# Patient Record
Sex: Male | Born: 1965 | Hispanic: Yes | Marital: Married | Smoking: Former smoker
Health system: Southern US, Community
[De-identification: ages and names within clinical notes are randomized; demographics above are authoritative.]

## PROBLEM LIST (undated history)

## (undated) DIAGNOSIS — E349 Endocrine disorder, unspecified: Secondary | ICD-10-CM

## (undated) DIAGNOSIS — M7710 Lateral epicondylitis, unspecified elbow: Secondary | ICD-10-CM

## (undated) DIAGNOSIS — E7800 Pure hypercholesterolemia, unspecified: Secondary | ICD-10-CM

## (undated) DIAGNOSIS — M25519 Pain in unspecified shoulder: Secondary | ICD-10-CM

## (undated) DIAGNOSIS — M5116 Intervertebral disc disorders with radiculopathy, lumbar region: Secondary | ICD-10-CM

## (undated) DIAGNOSIS — F411 Generalized anxiety disorder: Secondary | ICD-10-CM

## (undated) DIAGNOSIS — M1712 Unilateral primary osteoarthritis, left knee: Secondary | ICD-10-CM

## (undated) DIAGNOSIS — I10 Essential (primary) hypertension: Secondary | ICD-10-CM

## (undated) DIAGNOSIS — F419 Anxiety disorder, unspecified: Secondary | ICD-10-CM

## (undated) DIAGNOSIS — F32A Depression, unspecified: Secondary | ICD-10-CM

## (undated) DIAGNOSIS — M549 Dorsalgia, unspecified: Secondary | ICD-10-CM

## (undated) DIAGNOSIS — K219 Gastro-esophageal reflux disease without esophagitis: Secondary | ICD-10-CM

## (undated) HISTORY — DX: Anxiety disorder, unspecified: F41.9

## (undated) HISTORY — DX: Essential (primary) hypertension: I10

## (undated) HISTORY — DX: Unilateral primary osteoarthritis, left knee: M17.12

## (undated) HISTORY — DX: Endocrine disorder, unspecified: E34.9

## (undated) HISTORY — DX: Depression, unspecified: F32.A

## (undated) HISTORY — DX: Lateral epicondylitis, unspecified elbow: M77.10

## (undated) HISTORY — DX: Pure hypercholesterolemia, unspecified: E78.00

## (undated) HISTORY — PX: BACK SURGERY: SHX140

## (undated) HISTORY — DX: Gastro-esophageal reflux disease without esophagitis: K21.9

## (undated) HISTORY — DX: Generalized anxiety disorder: F41.1

## (undated) HISTORY — DX: Intervertebral disc disorders with radiculopathy, lumbar region: M51.16

## (undated) HISTORY — PX: ROTATOR CUFF REPAIR: SHX139

---

## 1998-09-29 ENCOUNTER — Ambulatory Visit (HOSPITAL_COMMUNITY): Admission: RE | Admit: 1998-09-29 | Discharge: 1998-09-29 | Payer: Self-pay | Admitting: Neurosurgery

## 1998-09-29 ENCOUNTER — Encounter: Payer: Self-pay | Admitting: Neurosurgery

## 1998-10-14 ENCOUNTER — Ambulatory Visit (HOSPITAL_COMMUNITY): Admission: RE | Admit: 1998-10-14 | Discharge: 1998-10-14 | Payer: Self-pay | Admitting: Neurosurgery

## 1998-10-14 ENCOUNTER — Encounter: Payer: Self-pay | Admitting: Neurosurgery

## 1998-10-28 ENCOUNTER — Ambulatory Visit (HOSPITAL_COMMUNITY): Admission: RE | Admit: 1998-10-28 | Discharge: 1998-10-28 | Payer: Self-pay | Admitting: Neurosurgery

## 1998-10-28 ENCOUNTER — Encounter: Payer: Self-pay | Admitting: Neurosurgery

## 1999-04-05 ENCOUNTER — Ambulatory Visit (HOSPITAL_COMMUNITY): Admission: RE | Admit: 1999-04-05 | Discharge: 1999-04-05 | Payer: Self-pay | Admitting: Neurosurgery

## 1999-04-05 ENCOUNTER — Encounter: Payer: Self-pay | Admitting: Neurosurgery

## 2000-01-13 ENCOUNTER — Encounter: Payer: Self-pay | Admitting: Neurosurgery

## 2000-01-13 ENCOUNTER — Ambulatory Visit (HOSPITAL_COMMUNITY): Admission: RE | Admit: 2000-01-13 | Discharge: 2000-01-13 | Payer: Self-pay | Admitting: Neurosurgery

## 2000-01-23 ENCOUNTER — Encounter: Admission: RE | Admit: 2000-01-23 | Discharge: 2000-02-26 | Payer: Self-pay | Admitting: Neurosurgery

## 2004-03-02 ENCOUNTER — Encounter: Admission: RE | Admit: 2004-03-02 | Discharge: 2004-03-02 | Payer: Self-pay | Admitting: Orthopedic Surgery

## 2004-03-03 ENCOUNTER — Encounter: Admission: RE | Admit: 2004-03-03 | Discharge: 2004-03-03 | Payer: Self-pay | Admitting: Orthopedic Surgery

## 2004-12-09 ENCOUNTER — Ambulatory Visit (HOSPITAL_COMMUNITY): Admission: RE | Admit: 2004-12-09 | Discharge: 2004-12-09 | Payer: Self-pay | Admitting: Specialist

## 2007-05-16 ENCOUNTER — Ambulatory Visit (HOSPITAL_COMMUNITY): Admission: RE | Admit: 2007-05-16 | Discharge: 2007-05-16 | Payer: Self-pay | Admitting: Otolaryngology

## 2007-09-17 ENCOUNTER — Encounter: Admission: RE | Admit: 2007-09-17 | Discharge: 2007-09-17 | Payer: Self-pay | Admitting: Family Medicine

## 2009-05-04 ENCOUNTER — Encounter: Admission: RE | Admit: 2009-05-04 | Discharge: 2009-05-04 | Payer: Self-pay | Admitting: Specialist

## 2014-07-28 ENCOUNTER — Other Ambulatory Visit (HOSPITAL_COMMUNITY): Payer: Self-pay | Admitting: Specialist

## 2014-07-28 DIAGNOSIS — Z139 Encounter for screening, unspecified: Secondary | ICD-10-CM

## 2014-07-30 ENCOUNTER — Ambulatory Visit (HOSPITAL_COMMUNITY)
Admission: RE | Admit: 2014-07-30 | Discharge: 2014-07-30 | Disposition: A | Discharge status: Home / Self Care | Payer: Self-pay | Source: Ambulatory Visit | Attending: Specialist | Admitting: Specialist

## 2014-07-30 DIAGNOSIS — Z139 Encounter for screening, unspecified: Secondary | ICD-10-CM | POA: Insufficient documentation

## 2016-01-15 ENCOUNTER — Encounter (HOSPITAL_COMMUNITY): Payer: Self-pay | Admitting: Emergency Medicine

## 2016-01-15 ENCOUNTER — Emergency Department (HOSPITAL_COMMUNITY)
Admission: EM | Admit: 2016-01-15 | Discharge: 2016-01-16 | Disposition: A | Discharge status: Other Acute Inpatient Hospital | Payer: BLUE CROSS/BLUE SHIELD | Attending: Emergency Medicine | Admitting: Emergency Medicine

## 2016-01-15 DIAGNOSIS — R112 Nausea with vomiting, unspecified: Secondary | ICD-10-CM | POA: Diagnosis not present

## 2016-01-15 DIAGNOSIS — Z79899 Other long term (current) drug therapy: Secondary | ICD-10-CM | POA: Diagnosis not present

## 2016-01-15 DIAGNOSIS — R197 Diarrhea, unspecified: Secondary | ICD-10-CM | POA: Diagnosis not present

## 2016-01-15 DIAGNOSIS — R509 Fever, unspecified: Secondary | ICD-10-CM | POA: Diagnosis not present

## 2016-01-15 DIAGNOSIS — F1110 Opioid abuse, uncomplicated: Secondary | ICD-10-CM | POA: Diagnosis not present

## 2016-01-15 DIAGNOSIS — F17210 Nicotine dependence, cigarettes, uncomplicated: Secondary | ICD-10-CM | POA: Insufficient documentation

## 2016-01-15 DIAGNOSIS — R45851 Suicidal ideations: Secondary | ICD-10-CM | POA: Diagnosis present

## 2016-01-15 HISTORY — DX: Dorsalgia, unspecified: M54.9

## 2016-01-15 HISTORY — DX: Pain in unspecified shoulder: M25.519

## 2016-01-15 LAB — ETHANOL

## 2016-01-15 LAB — COMPREHENSIVE METABOLIC PANEL
ALBUMIN: 4.4 g/dL (ref 3.5–5.0)
ALK PHOS: 57 U/L (ref 38–126)
ALT: 24 U/L (ref 17–63)
ANION GAP: 15 (ref 5–15)
AST: 33 U/L (ref 15–41)
BILIRUBIN TOTAL: 0.6 mg/dL (ref 0.3–1.2)
BUN: 10 mg/dL (ref 6–20)
CALCIUM: 9.9 mg/dL (ref 8.9–10.3)
CO2: 22 mmol/L (ref 22–32)
Chloride: 104 mmol/L (ref 101–111)
Creatinine, Ser: 1.08 mg/dL (ref 0.61–1.24)
GFR calc Af Amer: >60 mL/min (ref >60)
GLUCOSE: 159 mg/dL — AB (ref 65–99)
POTASSIUM: 4.3 mmol/L (ref 3.5–5.1)
Sodium: 141 mmol/L (ref 135–145)
TOTAL PROTEIN: 7.3 g/dL (ref 6.5–8.1)

## 2016-01-15 LAB — CBC
HEMATOCRIT: 57.3 % — AB (ref 39.0–52.0)
Hemoglobin: 20 g/dL — ABNORMAL HIGH (ref 13.0–17.0)
MCH: 30.6 pg (ref 26.0–34.0)
MCHC: 34.9 g/dL (ref 30.0–36.0)
MCV: 87.7 fL (ref 78.0–100.0)
PLATELETS: 246 10*3/uL (ref 150–400)
RBC: 6.53 MIL/uL — ABNORMAL HIGH (ref 4.22–5.81)
RDW: 13.7 % (ref 11.5–15.5)
WBC: 8.8 10*3/uL (ref 4.0–10.5)

## 2016-01-15 LAB — RAPID URINE DRUG SCREEN, HOSP PERFORMED
Amphetamines: NOT DETECTED
BARBITURATES: NOT DETECTED
Benzodiazepines: NOT DETECTED
Cocaine: NOT DETECTED
Opiates: POSITIVE — AB
Tetrahydrocannabinol: NOT DETECTED

## 2016-01-15 LAB — SALICYLATE LEVEL: Salicylate Lvl: <4 mg/dL (ref 2.8–30.0)

## 2016-01-15 LAB — ACETAMINOPHEN LEVEL: Acetaminophen (Tylenol), Serum: <10 ug/mL — ABNORMAL LOW (ref 10–30)

## 2016-01-15 MED ORDER — DIPHENOXYLATE-ATROPINE 2.5-0.025 MG PO TABS
1.0000 | ORAL_TABLET | Freq: Once | ORAL | Status: AC
  Administered 2016-01-15: 1 via ORAL
  Filled 2016-01-15: qty 1

## 2016-01-15 MED ORDER — ACETAMINOPHEN 325 MG PO TABS
650.0000 mg | ORAL_TABLET | Freq: Once | ORAL | Status: AC
  Administered 2016-01-15: 650 mg via ORAL

## 2016-01-15 MED ORDER — CHLORDIAZEPOXIDE HCL 25 MG PO CAPS
50.0000 mg | ORAL_CAPSULE | Freq: Once | ORAL | Status: AC
  Administered 2016-01-15: 50 mg via ORAL
  Filled 2016-01-15: qty 2

## 2016-01-15 MED ORDER — CLONIDINE HCL 0.1 MG PO TABS
0.1000 mg | ORAL_TABLET | Freq: Once | ORAL | Status: AC
  Administered 2016-01-15: 0.1 mg via ORAL
  Filled 2016-01-15: qty 1

## 2016-01-15 NOTE — ED Notes (Signed)
Notified staffing for sitter. 

## 2016-01-15 NOTE — ED Provider Notes (Signed)
CSN: [id number]     Arrival date & time 01/15/16  1724 History   First MD Initiated Contact with Patient 01/15/16 1751     Chief Complaint  Patient presents with  . Withdrawal  . SI      (Consider location/radiation/quality/duration/timing/severity/associated sxs/prior Treatment) Patient is a 50 y.o. male presenting with mental health disorder. The history is provided by the patient and a relative.  Mental Health Problem Presenting symptoms: suicidal thoughts and suicidal threats   Presenting symptoms: no aggressive behavior, no agitation, no hallucinations, no homicidal ideas and no suicide attempt   Patient accompanied by:  Family member Degree of incapacity (severity):  Moderate Onset quality:  Gradual Duration:  2 weeks Timing:  Constant Progression:  Worsening Chronicity:  New Context: drug abuse   Treatment compliance:  Untreated Relieved by:  Nothing Worsened by:  Nothing tried Ineffective treatments:  None tried Associated symptoms: no abdominal pain, no chest pain and no headaches     50 yo M With a chief complaint of suicidal ideation. Patient states he's been having trouble with his family and doing narcotics. Patient said he did heroin earlier this morning. Has had some withdrawal symptoms some vomiting and diarrhea as well as some chills. Denies cough congestion. Denies sick contacts. Has not had difficulties with withdrawal before. Denies history of suicidal ideation. Denies mental health history. Feels that he's at the end of his rope and mentioned a couple different times that he can wish he would go to sleep and not wake up. Has no current plan to commit suicide.  Past Medical History  Diagnosis Date  . Shoulder pain   . Back pain    Past Surgical History  Procedure Laterality Date  . Back surgery    . Rotator cuff repair     No family history on file. Social History  Substance Use Topics  . Smoking status: Current Some Day Smoker -- 0.20 packs/day   Types: Cigarettes  . Smokeless tobacco: Never Used  . Alcohol Use: No    Review of Systems  Constitutional: Positive for fever and chills.  HENT: Negative for congestion and facial swelling.   Eyes: Negative for discharge and visual disturbance.  Respiratory: Negative for shortness of breath.   Cardiovascular: Negative for chest pain and palpitations.  Gastrointestinal: Positive for nausea, vomiting and diarrhea. Negative for abdominal pain.  Musculoskeletal: Negative for myalgias and arthralgias.  Skin: Negative for color change and rash.  Neurological: Negative for tremors, syncope and headaches.  Psychiatric/Behavioral: Positive for suicidal ideas. Negative for homicidal ideas, hallucinations, confusion, dysphoric mood and agitation.      Allergies  Review of patient's allergies indicates no known allergies.  Home Medications   Prior to Admission medications   Medication Sig Start Date End Date Taking? Authorizing Provider  acetaminophen (TYLENOL) 500 MG tablet Take 1,000-1,500 mg by mouth every 6 (six) hours as needed for headache (pain).   Yes Historical Provider, MD  atenolol (TENORMIN) 25 MG tablet Take 25 mg by mouth daily.   Yes Historical Provider, MD  meloxicam (MOBIC) 7.5 MG tablet Take 7.5 mg by mouth daily as needed (muscle pain/sleep).   Yes Historical Provider, MD  traMADol (ULTRAM) 50 MG tablet Take 100 mg by mouth every 4 (four) hours as needed (pain).   Yes Historical Provider, MD   BP 137/101 mmHg  Pulse 101  Temp(Src) 98.1 F (36.7 C) (Oral)  Resp 16  Wt 170 lb (77.111 kg)  SpO2 97% Physical Exam  Constitutional:  He is oriented to person, place, and time. He appears well-developed and well-nourished.  Piloerection  HENT:  Head: Normocephalic and atraumatic.  Eyes: EOM are normal. Pupils are equal, round, and reactive to light.  Neck: Normal range of motion. Neck supple. No JVD present.  Cardiovascular: Normal rate and regular rhythm.  Exam reveals no  gallop and no friction rub.   No murmur heard. Pulmonary/Chest: No respiratory distress. He has no wheezes. He has no rales.  Abdominal: He exhibits no distension. There is no tenderness. There is no rebound and no guarding.  Musculoskeletal: Normal range of motion.  Neurological: He is alert and oriented to person, place, and time.  Skin: No rash noted. No pallor.  Psychiatric: He has a normal mood and affect. His behavior is normal.  Nursing note and vitals reviewed.   ED Course  Procedures (including critical care time) Labs Review Labs Reviewed  COMPREHENSIVE METABOLIC PANEL - Abnormal; Notable for the following:    Glucose, Bld 159 (*)    All other components within normal limits  ACETAMINOPHEN LEVEL - Abnormal; Notable for the following:    Acetaminophen (Tylenol), Serum <10 (*)    All other components within normal limits  CBC - Abnormal; Notable for the following:    RBC 6.53 (*)    Hemoglobin 20.0 (*)    HCT 57.3 (*)    All other components within normal limits  URINE RAPID DRUG SCREEN, HOSP PERFORMED - Abnormal; Notable for the following:    Opiates POSITIVE (*)    All other components within normal limits  ETHANOL  SALICYLATE LEVEL    Imaging Review No results found. I have personally reviewed and evaluated these images and lab results as part of my medical decision-making.   EKG Interpretation None      MDM   Final diagnoses:  Opiate abuse, continuous    50 yo M with a chief complaint of suicidal ideation. Patient has no current plan. Is complaining of some withdrawal symptoms. Some piloerection mild warmth of the skin on exam. Will treat symptomatically. TTS evaluation.  TTS recommending inpatient rehab.  Looking for placement.   The patients results and plan were reviewed and discussed.   Any x-rays performed were independently reviewed by myself.   Differential diagnosis were considered with the presenting HPI.  Medications  cloNIDine  (CATAPRES) tablet 0.1 mg (0.1 mg Oral Given 01/15/16 1859)  diphenoxylate-atropine (LOMOTIL) 2.5-0.025 MG per tablet 1 tablet (1 tablet Oral Given 01/15/16 1859)  chlordiazePOXIDE (LIBRIUM) capsule 50 mg (50 mg Oral Given 01/15/16 1859)  acetaminophen (TYLENOL) tablet 650 mg (650 mg Oral Given 01/15/16 2045)    Filed Vitals:   01/15/16 1900 01/15/16 1915 01/15/16 2000 01/15/16 2030  BP: 139/112 154/104 146/104 137/101  Pulse: 123 113 96 101  Temp:      TempSrc:      Resp: 18   16  Weight:      SpO2: 99% 96% 99% 97%    Final diagnoses:  Opiate abuse, continuous    Admission/ observation were discussed with the admitting physician, patient and/or family and they are comfortable with the plan.    Melene Plan, DO 01/15/16 2322

## 2016-01-15 NOTE — ED Notes (Signed)
Pt from home with daughter, requesting detox from opiate pills.  Pt states he was taking tramadol for his shoulder pain and then started buying other pills "on the side."  Pt states he is now having trouble sleeping and is having thoughts of hurting himself due to lack of sleep and pain.  Denies plan.  NAD, tearful, A&O.

## 2016-01-15 NOTE — ED Notes (Addendum)
Pt and family advised of visitor policy.  Given Pod C information sheet.  Pt family to take all of pt's belongings.  Security called to wand pt.

## 2016-01-15 NOTE — ED Notes (Signed)
Per [facility] ([place]), to seek outside placement for detox.

## 2016-01-15 NOTE — BHH Counselor (Signed)
TTS Consult incomplete due to poor connectivity. Writer contacted Varney Biles to inquire about pt being temporarily moved so that Clinical research associate can attempt consult again.
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
## 2016-01-16 ENCOUNTER — Encounter (HOSPITAL_COMMUNITY): Payer: Self-pay | Admitting: *Deleted

## 2016-01-16 ENCOUNTER — Encounter (HOSPITAL_COMMUNITY): Payer: Self-pay

## 2016-01-16 ENCOUNTER — Inpatient Hospital Stay (HOSPITAL_COMMUNITY)
Admission: AD | Admit: 2016-01-16 | Discharge: 2016-01-20 | DRG: 897 | Disposition: A | Discharge status: Home / Self Care | Payer: BLUE CROSS/BLUE SHIELD | Source: Intra-hospital | Attending: Psychiatry | Admitting: Psychiatry

## 2016-01-16 DIAGNOSIS — F1123 Opioid dependence with withdrawal: Principal | ICD-10-CM | POA: Diagnosis present

## 2016-01-16 DIAGNOSIS — F1120 Opioid dependence, uncomplicated: Secondary | ICD-10-CM | POA: Diagnosis present

## 2016-01-16 DIAGNOSIS — F331 Major depressive disorder, recurrent, moderate: Secondary | ICD-10-CM | POA: Diagnosis present

## 2016-01-16 DIAGNOSIS — F1193 Opioid use, unspecified with withdrawal: Secondary | ICD-10-CM

## 2016-01-16 DIAGNOSIS — R45851 Suicidal ideations: Secondary | ICD-10-CM | POA: Diagnosis not present

## 2016-01-16 DIAGNOSIS — G4700 Insomnia, unspecified: Secondary | ICD-10-CM | POA: Diagnosis present

## 2016-01-16 DIAGNOSIS — F17210 Nicotine dependence, cigarettes, uncomplicated: Secondary | ICD-10-CM | POA: Diagnosis present

## 2016-01-16 MED ORDER — LORAZEPAM 1 MG PO TABS
0.5000 mg | ORAL_TABLET | Freq: Three times a day (TID) | ORAL | Status: DC | PRN
  Administered 2016-01-16: 0.5 mg via ORAL
  Filled 2016-01-16: qty 1

## 2016-01-16 MED ORDER — TRAZODONE HCL 50 MG PO TABS
50.0000 mg | ORAL_TABLET | Freq: Every evening | ORAL | Status: DC | PRN
  Administered 2016-01-16 – 2016-01-18 (×3): 50 mg via ORAL
  Filled 2016-01-16 (×3): qty 1

## 2016-01-16 MED ORDER — NAPROXEN 500 MG PO TABS
500.0000 mg | ORAL_TABLET | Freq: Two times a day (BID) | ORAL | Status: DC | PRN
  Administered 2016-01-16 – 2016-01-19 (×6): 500 mg via ORAL
  Filled 2016-01-16 (×6): qty 1

## 2016-01-16 MED ORDER — ACETAMINOPHEN 500 MG PO TABS
1000.0000 mg | ORAL_TABLET | Freq: Four times a day (QID) | ORAL | Status: DC | PRN
  Administered 2016-01-16: 1000 mg via ORAL
  Filled 2016-01-16: qty 2

## 2016-01-16 MED ORDER — ALUM & MAG HYDROXIDE-SIMETH 200-200-20 MG/5ML PO SUSP: 30.0000 mL | ORAL | Status: DC | PRN

## 2016-01-16 MED ORDER — MELOXICAM 7.5 MG PO TABS
7.5000 mg | ORAL_TABLET | Freq: Every day | ORAL | Status: DC | PRN
Start: 2016-01-16 — End: 2016-01-16
  Administered 2016-01-16: 7.5 mg via ORAL
  Filled 2016-01-16: qty 1

## 2016-01-16 MED ORDER — CLONIDINE HCL 0.1 MG PO TABS
0.1000 mg | ORAL_TABLET | Freq: Four times a day (QID) | ORAL | Status: DC | PRN
  Administered 2016-01-16: 0.1 mg via ORAL
  Filled 2016-01-16: qty 1

## 2016-01-16 MED ORDER — HYDROXYZINE HCL 25 MG PO TABS
25.0000 mg | ORAL_TABLET | Freq: Four times a day (QID) | ORAL | Status: DC | PRN
  Administered 2016-01-16 – 2016-01-18 (×7): 25 mg via ORAL
  Filled 2016-01-16 (×7): qty 1

## 2016-01-16 MED ORDER — MAGNESIUM HYDROXIDE 400 MG/5ML PO SUSP: 30.0000 mL | Freq: Every day | ORAL | Status: DC | PRN

## 2016-01-16 MED ORDER — CLONIDINE HCL 0.1 MG PO TABS
0.1000 mg | ORAL_TABLET | ORAL | Status: AC
  Administered 2016-01-18 – 2016-01-19 (×4): 0.1 mg via ORAL
  Filled 2016-01-16 (×5): qty 1

## 2016-01-16 MED ORDER — CLONIDINE HCL 0.1 MG PO TABS
0.1000 mg | ORAL_TABLET | Freq: Four times a day (QID) | ORAL | Status: AC
  Administered 2016-01-16 – 2016-01-17 (×6): 0.1 mg via ORAL
  Filled 2016-01-16 (×8): qty 1

## 2016-01-16 MED ORDER — ONDANSETRON 4 MG PO TBDP: 4.0000 mg | ORAL_TABLET | Freq: Four times a day (QID) | ORAL | Status: DC | PRN

## 2016-01-16 MED ORDER — ACETAMINOPHEN 325 MG PO TABS
650.0000 mg | ORAL_TABLET | Freq: Four times a day (QID) | ORAL | Status: DC | PRN
  Administered 2016-01-16 – 2016-01-19 (×3): 650 mg via ORAL
  Filled 2016-01-16 (×4): qty 2

## 2016-01-16 MED ORDER — ATENOLOL 25 MG PO TABS
25.0000 mg | ORAL_TABLET | Freq: Every day | ORAL | Status: DC
  Administered 2016-01-16: 25 mg via ORAL
  Filled 2016-01-16: qty 1

## 2016-01-16 MED ORDER — CLONIDINE HCL 0.1 MG PO TABS
0.1000 mg | ORAL_TABLET | Freq: Every day | ORAL | Status: DC
  Administered 2016-01-20: 0.1 mg via ORAL
  Filled 2016-01-16 (×2): qty 1

## 2016-01-16 MED ORDER — LOPERAMIDE HCL 2 MG PO CAPS
2.0000 mg | ORAL_CAPSULE | ORAL | Status: DC | PRN
  Administered 2016-01-18: 4 mg via ORAL
  Filled 2016-01-16: qty 2

## 2016-01-16 MED ORDER — METHOCARBAMOL 500 MG PO TABS
500.0000 mg | ORAL_TABLET | Freq: Three times a day (TID) | ORAL | Status: DC | PRN
  Administered 2016-01-16 – 2016-01-19 (×5): 500 mg via ORAL
  Filled 2016-01-16 (×5): qty 1

## 2016-01-16 MED ORDER — DICYCLOMINE HCL 20 MG PO TABS
20.0000 mg | ORAL_TABLET | Freq: Four times a day (QID) | ORAL | Status: DC | PRN
  Administered 2016-01-16 – 2016-01-18 (×3): 20 mg via ORAL
  Filled 2016-01-16 (×3): qty 1

## 2016-01-16 NOTE — ED Provider Notes (Signed)
Patient was accepted to psychiatric facility by Dr Earlene Plater. Pt is medically stable, no acute distress on exam and able to be transferred safely to facility capable of providing appropriate care.   Lyndal Pulley, MD 01/16/16 1058

## 2016-01-16 NOTE — ED Notes (Signed)
Updated pt. And family on plan of care and visitor hours and restrictions.  They verbalized understanding

## 2016-01-16 NOTE — ED Notes (Signed)
Patient didn't want a snack, but he did have a drink at bedside, A regular diet ordered for lunch.
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
## 2016-01-16 NOTE — Progress Notes (Signed)
Pt accepted to [facility] observation unit by Dr Lolly Mustache to Fransisca Kaufmann NP. Report number is [id number], pt can be transferred 11am today.  Note- pt was referred for inpatient detox yesterday at [facility] and [place], however this morning [facility] states they do not have an inpatient opiate detox program, and [facility] states they did not receive the referral and advised fax again. Writer made referral in case of further detox needs.  Ilean Skill, MSW, LCSW Clinical Social Work, Disposition  01/16/2016 [phone]

## 2016-01-16 NOTE — ED Notes (Signed)
Pt.reports feeling anxious and restless.  Ativan 0.5mg  po given per MD orders

## 2016-01-16 NOTE — H&P (Signed)
[facility] Admission Assessment Adult  Patient Identification: Victor [name] MRN:  [id number] Date of Evaluation:  01/16/2016 Chief Complaint:  Patient states "I feel depressed because of my opiate use."  Principal Diagnosis: <principal problem not specified> Diagnosis:   Patient Active Problem List   Diagnosis Date Noted  . Opiate addiction [facility]) [F11.20] 01/16/2016   History of Present Illness::   Victor [name] is a 50 year old male who presented to the [facility] voluntarily with his daughter requesting detox from opiate pain pills. Patient reported taking ultram for at least sixteen years for shoulder pain but it became ineffective. He reports that his orthopedic MD wanted to perform another back surgery but patient declined. This led the patient to begin buying vicodin off the street to manage his pain. Patient tearfully states "I hurt my back years ago lifting something at work. I want to stop the medications. If I don't have them my body hurts and I can't function. This situation makes me feel so worthless. I know my family is concerned about me. I started to have thoughts of overdosing on medications. I did not do it because of my family. I would rather not take anything for pain than live like this. The vicodin use varies. I would take anywhere from seven to twelve a day. I was also worried about my health like my liver. When I have the pills everything is fine. But I feel terrible when I can not get them. I am appreciative of the help received here."   Associated Signs/Symptoms: Depression Symptoms:  depressed mood, feelings of worthlessness/guilt, hopelessness, recurrent thoughts of death, suicidal thoughts without plan, anxiety, loss of energy/fatigue, disturbed sleep, (Hypo) Manic Symptoms:  Denies Anxiety Symptoms:  Denies Psychotic Symptoms:  Denies PTSD Symptoms: Denies Total Time spent with patient: 45 minutes  Past Psychiatric History: Denies  Is the patient  at risk to self? Yes.    Has the patient been a risk to self in the past 6 months? Yes.    Has the patient been a risk to self within the distant past? No.  Is the patient a risk to others? No.  Has the patient been a risk to others in the past 6 months? No.  Has the patient been a risk to others within the distant past? No.   Prior Inpatient Therapy:   Prior Outpatient Therapy:    Alcohol Screening: Patient refused Alcohol Screening Tool: Yes 1. How often do you have a drink containing alcohol?: Never 9. Have you or someone else been injured as a result of your drinking?: No 10. Has a relative or friend or a doctor or another health worker been concerned about your drinking or suggested you cut down?: No Alcohol Use Disorder Identification Test Final Score (AUDIT): 0 Brief Intervention: Patient declined brief intervention Substance Abuse History in the last 12 months:  Yes.   Consequences of Substance Abuse: Withdrawal Symptoms:   Cramps Body aches  Previous Psychotropic Medications: No  Psychological Evaluations: No  Past Medical History:  Past Medical History  Diagnosis Date  . Shoulder pain   . Back pain     Past Surgical History  Procedure Laterality Date  . Back surgery    . Rotator cuff repair     Family History: History reviewed. No pertinent family history. Family Psychiatric  History: Denies Tobacco Screening: None Social History:  History  Alcohol Use No     History  Drug Use  . Yes    Comment: Opiate  pills    Additional Social History:                           Allergies:  No Known Allergies Lab Results:  Results for orders placed or performed during the hospital encounter of 01/15/16 (from the past 48 hour(s))  Comprehensive metabolic panel     Status: Abnormal   Collection Time: 01/15/16  5:40 PM  Result Value Ref Range   Sodium 141 135 - 145 mmol/L   Potassium 4.3 3.5 - 5.1 mmol/L   Chloride 104 101 - 111 mmol/L   CO2 22 22 - 32  mmol/L   Glucose, Bld 159 (H) 65 - 99 mg/dL   BUN 10 6 - 20 mg/dL   Creatinine, Ser 1.08 0.61 - 1.24 mg/dL   Calcium 9.9 8.9 - 10.3 mg/dL   Total Protein 7.3 6.5 - 8.1 g/dL   Albumin 4.4 3.5 - 5.0 g/dL   AST 33 15 - 41 U/L   ALT 24 17 - 63 U/L   Alkaline Phosphatase 57 38 - 126 U/L   Total Bilirubin 0.6 0.3 - 1.2 mg/dL   GFR calc non Af Amer >60 >60 mL/min   GFR calc Af Amer >60 >60 mL/min    Comment: (NOTE) The eGFR has been calculated using the CKD EPI equation. This calculation has not been validated in all clinical situations. eGFR's persistently <60 mL/min signify possible Chronic Kidney Disease.    Anion gap 15 5 - 15  Ethanol (ETOH)     Status: None   Collection Time: 01/15/16  5:40 PM  Result Value Ref Range   Alcohol, Ethyl (B) <5 <5 mg/dL    Comment:        LOWEST DETECTABLE LIMIT FOR SERUM ALCOHOL IS 5 mg/dL FOR MEDICAL PURPOSES ONLY   Salicylate level     Status: None   Collection Time: 01/15/16  5:40 PM  Result Value Ref Range   Salicylate Lvl <5.0 2.8 - 30.0 mg/dL  Acetaminophen level     Status: Abnormal   Collection Time: 01/15/16  5:40 PM  Result Value Ref Range   Acetaminophen (Tylenol), Serum <10 (L) 10 - 30 ug/mL    Comment:        THERAPEUTIC CONCENTRATIONS VARY SIGNIFICANTLY. A RANGE OF 10-30 ug/mL MAY BE AN EFFECTIVE CONCENTRATION FOR MANY PATIENTS. HOWEVER, SOME ARE BEST TREATED AT CONCENTRATIONS OUTSIDE THIS RANGE. ACETAMINOPHEN CONCENTRATIONS >150 ug/mL AT 4 HOURS AFTER INGESTION AND >50 ug/mL AT 12 HOURS AFTER INGESTION ARE OFTEN ASSOCIATED WITH TOXIC REACTIONS.   CBC     Status: Abnormal   Collection Time: 01/15/16  5:40 PM  Result Value Ref Range   WBC 8.8 4.0 - 10.5 K/uL   RBC 6.53 (H) 4.22 - 5.81 MIL/uL   Hemoglobin 20.0 (H) 13.0 - 17.0 g/dL   HCT 57.3 (H) 39.0 - 52.0 %   MCV 87.7 78.0 - 100.0 fL   MCH 30.6 26.0 - 34.0 pg   MCHC 34.9 30.0 - 36.0 g/dL   RDW 13.7 11.5 - 15.5 %   Platelets 246 150 - 400 K/uL  Urine rapid drug  screen (hosp performed) (Not at [facility])     Status: Abnormal   Collection Time: 01/15/16  7:13 PM  Result Value Ref Range   Opiates POSITIVE (A) NONE DETECTED   Cocaine NONE DETECTED NONE DETECTED   Benzodiazepines NONE DETECTED NONE DETECTED   Amphetamines NONE DETECTED NONE DETECTED   Tetrahydrocannabinol  NONE DETECTED NONE DETECTED   Barbiturates NONE DETECTED NONE DETECTED    Comment:        DRUG SCREEN FOR MEDICAL PURPOSES ONLY.  IF CONFIRMATION IS NEEDED FOR ANY PURPOSE, NOTIFY LAB WITHIN 5 DAYS.        LOWEST DETECTABLE LIMITS FOR URINE DRUG SCREEN Drug Class       Cutoff (ng/mL) Amphetamine      1000 Barbiturate      200 Benzodiazepine   200 Tricyclics       300 Opiates          300 Cocaine          300 THC              50     Blood Alcohol level:  Lab Results  Component Value Date   ETH <5 01/15/2016    Metabolic Disorder Labs:  No results found for: HGBA1C, MPG No results found for: PROLACTIN No results found for: CHOL, TRIG, HDL, CHOLHDL, VLDL, LDLCALC  Current Medications: Current Facility-Administered Medications  Medication Dose Route Frequency Provider Last Rate Last Dose  . acetaminophen (TYLENOL) tablet 650 mg  650 mg Oral Q6H PRN Thermon Leyland, NP   650 mg at 01/16/16 1730  . alum & mag hydroxide-simeth (MAALOX/MYLANTA) 200-200-20 MG/5ML suspension 30 mL  30 mL Oral Q4H PRN Thermon Leyland, NP      . cloNIDine (CATAPRES) tablet 0.1 mg  0.1 mg Oral QID Thermon Leyland, NP   0.1 mg at 01/16/16 1730   Followed by  . [START ON 01/18/2016] cloNIDine (CATAPRES) tablet 0.1 mg  0.1 mg Oral BH-qamhs Thermon Leyland, NP       Followed by  . [START ON 01/20/2016] cloNIDine (CATAPRES) tablet 0.1 mg  0.1 mg Oral QAC breakfast Thermon Leyland, NP      . dicyclomine (BENTYL) tablet 20 mg  20 mg Oral Q6H PRN Thermon Leyland, NP      . hydrOXYzine (ATARAX/VISTARIL) tablet 25 mg  25 mg Oral Q6H PRN Thermon Leyland, NP   25 mg at 01/16/16 1515  . loperamide (IMODIUM) capsule 2-4 mg   2-4 mg Oral PRN Thermon Leyland, NP      . magnesium hydroxide (MILK OF MAGNESIA) suspension 30 mL  30 mL Oral Daily PRN Thermon Leyland, NP      . methocarbamol (ROBAXIN) tablet 500 mg  500 mg Oral Q8H PRN Thermon Leyland, NP   500 mg at 01/16/16 1516  . naproxen (NAPROSYN) tablet 500 mg  500 mg Oral BID PRN Thermon Leyland, NP   500 mg at 01/16/16 1515  . ondansetron (ZOFRAN-ODT) disintegrating tablet 4 mg  4 mg Oral Q6H PRN Thermon Leyland, NP      . traZODone (DESYREL) tablet 50 mg  50 mg Oral QHS PRN Thermon Leyland, NP       PTA Medications: Prescriptions prior to admission  Medication Sig Dispense Refill Last Dose  . acetaminophen (TYLENOL) 500 MG tablet Take 1,000-1,500 mg by mouth every 6 (six) hours as needed for headache (pain).   couple days ago  . atenolol (TENORMIN) 25 MG tablet Take 25 mg by mouth daily.   2 weeks ago at night  . meloxicam (MOBIC) 7.5 MG tablet Take 7.5 mg by mouth daily as needed (muscle pain/sleep).   01/14/2016 at Unknown time  . traMADol (ULTRAM) 50 MG tablet Take 100 mg by mouth every 4 (four) hours  as needed (pain).   01/14/2016 at Unknown time    Musculoskeletal: Strength & Muscle Tone: within normal limits Gait & Station: normal Patient leans: N/A  Psychiatric Specialty Exam: Physical Exam  Review of Systems  Constitutional: Positive for chills.  Musculoskeletal: Positive for back pain.  Psychiatric/Behavioral: Positive for depression, suicidal ideas and substance abuse. Negative for hallucinations and memory loss. The patient does not have insomnia.     Blood pressure 131/96, pulse 96, temperature 98.9 F (37.2 C), temperature source Oral, resp. rate 20, height '5\' 6"'$  (1.676 m), weight 77.111 kg (170 lb), SpO2 100 %.Body mass index is 27.45 kg/(m^2).  General Appearance: Casual  Eye Contact::  Good  Speech:  Clear and Coherent  Volume:  Normal  Mood:  Dysphoric  Affect:  Tearful  Thought Process:  Goal Directed and Intact  Orientation:  Full (Time,  Place, and Person)  Thought Content:  Symptoms, worries   Suicidal Thoughts:  Yes.  without intent/plan  Homicidal Thoughts:  No  Memory:  Immediate;   Good Recent;   Good Remote;   Good  Judgement:  Fair  Insight:  Present  Psychomotor Activity:  Decreased  Concentration:  Good  Recall:  Good  Fund of Knowledge:Good  Language: Good  Akathisia:  No  Handed:  Right  AIMS (if indicated):     Assets:  Communication Skills Desire for Improvement Financial Resources/Insurance Housing Intimacy Leisure Time Physical Health Resilience Social Support  ADL's:  Intact  Cognition: WNL  Sleep:        Treatment Plan Summary: Daily contact with patient to assess and evaluate symptoms and progress in treatment and Medication management  Observation Level/Precautions:  Continuous Observation  Laboratory:  CBC Chemistry Profile UDS  Psychotherapy:  Individual   Medications:  Clonidine protocol for opiate detox   Consultations:  As needed  Discharge Concerns:  Continued narcotic use  Estimated LOS: Less than 48 hours  Other:  Collateral information from family, Counselor to explore residential treatment options with patient to address his opiate addiction.    Elmarie Shiley, NP 2/27/20175:47 PM  I agree with assessment and plan Geralyn Flash A. Sabra Heck, M.D.

## 2016-01-16 NOTE — ED Notes (Signed)
Pt. Was not discharged to [facility] .

## 2016-01-16 NOTE — Progress Notes (Signed)
Pt on unit at shift change getting ready to visit with family in conference room with Onalee Hua L.  Pt returns and requires scrub change due to excessive sweat.  Pt is fidgety in bed, rising to sitting position and moving legs back and forth while prone.  Pt states he has back spasms and he has back and leg pain.  Prn meds not available at this time.  Attempt to make pt comfortable with snacks and diversion to watching TV.   Pt given crackers, soda and ice cream  Pt appears calm and watching TV.

## 2016-01-16 NOTE — BH Assessment (Signed)
BHH Assessment Progress Note Patient was seen on admission by Earlene Plater NP and Yalissa Fink LCAS. Patient presents this date reporting ongoing Opiate use stating he has been using 6 (or more) Hydrocodone 7.5  daily for the last few months along with Tramadol and has discontinued his opiate use on 2/26 when he stated "he had enough of this life." Patient has been obtaining the Hydrocodone illegally although the Tramadol was prescribed for a lower back injury. Patient presents very tearful and states he is "ashamed of himself" for becoming a "drug addict". Patient denies any other SA use and denies any previous MH hospital admission/s. Patient states he had thoughts of self harm this date but no plan. Patient will be started on a opiate detox protocol per Earlene Plater NP. Patient will be re-evaluated in the a.m. to determine disposition. Patient will be provided with outpatient resources to address SA issues on discharge and pain management referrals. Patient's physician who is writing for Tramadol may also provide referrals at patient's request.

## 2016-01-16 NOTE — ED Notes (Signed)
Updated family on plan of care, and pt. Also.  He verbalizes understanding of plan of care and he agrees to going to [facility].

## 2016-01-17 DIAGNOSIS — G4700 Insomnia, unspecified: Secondary | ICD-10-CM | POA: Diagnosis present

## 2016-01-17 DIAGNOSIS — F1120 Opioid dependence, uncomplicated: Secondary | ICD-10-CM | POA: Diagnosis present

## 2016-01-17 DIAGNOSIS — F331 Major depressive disorder, recurrent, moderate: Secondary | ICD-10-CM | POA: Diagnosis present

## 2016-01-17 DIAGNOSIS — F1123 Opioid dependence with withdrawal: Secondary | ICD-10-CM | POA: Diagnosis present

## 2016-01-17 DIAGNOSIS — F17210 Nicotine dependence, cigarettes, uncomplicated: Secondary | ICD-10-CM | POA: Diagnosis present

## 2016-01-17 MED ORDER — NICOTINE 21 MG/24HR TD PT24
21.0000 mg | MEDICATED_PATCH | Freq: Every day | TRANSDERMAL | Status: DC
  Administered 2016-01-17 – 2016-01-19 (×4): 21 mg via TRANSDERMAL
  Filled 2016-01-17 (×5): qty 1

## 2016-01-17 NOTE — Discharge Summary (Signed)
Physician Discharge Summary Note  Patient:  Victor [name] is an 50 y.o., male MRN:  [id number] DOB:  [birth year] Patient phone:  [phone] (home)  Patient address:   [address] [place] [address],  Total Time spent with patient: 30 minutes  Date of Admission:  01/16/2016 Date of Discharge: 01/17/2016  Reason for Admission:   Victor [name] is a 50 year old male who presented to the [facility] voluntarily with his daughter requesting detox from opiate pain pills. Patient reported taking ultram for at least sixteen years for shoulder pain but it became ineffective. He reports that his orthopedic MD wanted to perform another back surgery but patient declined. This led the patient to begin buying vicodin off the street to manage his pain. Patient tearfully states "I hurt my back years ago lifting something at work. I want to stop the medications. If I don't have them my body hurts and I can't function. This situation makes me feel so worthless. I know my family is concerned about me. I started to have thoughts of overdosing on medications. I did not do it because of my family. I would rather not take anything for pain than live like this. The vicodin use varies. I would take anywhere from seven to twelve a day. I was also worried about my health like my liver. When I have the pills everything is fine. But I feel terrible when I can not get them. I am appreciative of the help received here."   Principal Problem: Opiate addiction Victor [name]) Discharge Diagnoses: Patient Active Problem List   Diagnosis Date Noted  . Opiate dependence ([facility]) [F11.20] 01/17/2016  . Opiate addiction Victor [name]) [F11.20] 01/16/2016    Past Psychiatric History: See Above  Past Medical History:  Past Medical History  Diagnosis Date  . Shoulder pain   . Back pain     Past Surgical History  Procedure Laterality Date  . Back surgery    . Rotator cuff repair     Family History: History reviewed. No pertinent family  history. Family Psychiatric  History: Unknown Social History:  History  Alcohol Use No     History  Drug Use  . Yes    Comment: Opiate pills    Social History   Social History  . Marital Status: Legally Separated    Spouse Name: N/A  . Number of Children: N/A  . Years of Education: N/A   Social History Main Topics  . Smoking status: Current Some Day Smoker -- 0.20 packs/day    Types: Cigarettes  . Smokeless tobacco: Never Used  . Alcohol Use: No  . Drug Use: Yes     Comment: Opiate pills  . Sexual Activity: Not Asked   Other Topics Concern  . None   Social History Narrative    [name] Course: Victor [name] was admitted for Opiate addiction Victor [name]) , with psychosis and crisis management.  Pt was treated discharged with the medications listed below under Medication List.  Medical problems were identified and treated as needed.  Home medications were restarted as appropriate.  Improvement was monitored by observation and Victor [name] 's daily report of symptom reduction.  Emotional and mental status was monitored by daily self-inventory reports completed by Victor [name] and clinical staff.         Victor [name] was evaluated by the treatment team for stability and plans for continued recovery upon discharge. Victor [name] 's motivation was an integral factor for scheduling further treatment. Employment, transportation, bed availability, health  status, family support, and any pending legal issues were also considered during [name] stay. Pt was offered further treatment options upon discharge including but not limited to Residential, Intensive Outpatient, and Outpatient treatment.  Victor [name] will follow up with the services as listed below under Follow Up Information.     Upon completion of this admission the patient was both mentally and medically stable for discharge denying suicidal/homicidal ideation, auditory/visual/tactile hallucinations, delusional thoughts and  paranoia.   Marland Kitchen    Physical Findings: AIMS: Facial and Oral Movements Muscles of Facial Expression: None, normal Lips and Perioral Area: None, normal Jaw: None, normal Tongue: None, normal,Extremity Movements Upper (arms, wrists, hands, fingers): None, normal Lower (legs, knees, ankles, toes): None, normal, Trunk Movements Neck, shoulders, hips: None, normal, Overall Severity Severity of abnormal movements (highest score from questions above): None, normal Incapacitation due to abnormal movements: None, normal Patient's awareness of abnormal movements (rate only patient's report): No Awareness, Dental Status Current problems with teeth and/or dentures?: No Does patient usually wear dentures?: No  CIWA:  CIWA-Ar Total: 6 COWS:  COWS Total Score: 10  Musculoskeletal: Strength & Muscle Tone: within normal limits Gait & Station: normal Patient leans: N/A  Psychiatric Specialty Exam: Review of Systems  Psychiatric/Behavioral: Positive for depression. The patient is nervous/anxious.   All other systems reviewed and are negative.   Blood pressure 109/79, pulse 73, temperature 98.1 F (36.7 C), temperature source Oral, resp. rate 18, height  (1.676 m), weight 77.111 kg (170 lb), SpO2 99 %.Body mass index is 27.45 kg/(m^2).  General Appearance: Casual  Eye Contact::  Good  Speech:  Clear and Coherent  Volume:  Decreased  Mood:  Anxious, Depressed and Irritable  Affect:  Depressed and Flat  Thought Process:  Intact  Orientation:  Full (Time, Place, and Person)  Thought Content:  Hallucinations: None  Suicidal Thoughts:  No  Homicidal Thoughts:  No  Memory:  Immediate;   Fair Recent;   Fair Remote;   Fair  Judgement:  Fair  Insight:  Fair  Psychomotor Activity:  Normal  Concentration:  Fair  Recall:  Good  Fund of Knowledge:Good  Language: Fair  Akathisia:  No  Handed:  Right  AIMS (if indicated):     Assets:  Desire for Improvement  ADL's:  Intact  Cognition: WNL   Sleep:      Have you used any form of tobacco in the last 30 days? (Cigarettes, Smokeless Tobacco, Cigars, and/or Pipes): Yes  Has this patient used any form of tobacco in the last 30 days? (Cigarettes, Smokeless Tobacco, Cigars, and/or Pipes) Yes, No  Blood Alcohol level:  Lab Results  Component Value Date   ETH <5 01/15/2016    Metabolic Disorder Labs:  No results found for: HGBA1C, MPG No results found for: PROLACTIN No results found for: CHOL, TRIG, HDL, CHOLHDL, VLDL, LDLCALC  See Psychiatric Specialty Exam and Suicide Risk Assessment completed by Attending Physician prior to discharge.  Discharge destination:  Other:  tranfer to [address] per MD [place]  Is patient on multiple antipsychotic therapies at discharge:  No   Has Patient had three or more failed trials of antipsychotic monotherapy by history:  No  Recommended Plan for Multiple Antipsychotic Therapies: NA     Medication List    ASK your doctor about these medications      Indication   acetaminophen 500 MG tablet  Commonly known as:  TYLENOL  Take 1,000-1,500 mg by mouth every 6 (six) hours as needed  for headache (pain).      atenolol 25 MG tablet  Commonly known as:  TENORMIN  Take 25 mg by mouth daily.      meloxicam 7.5 MG tablet  Commonly known as:  MOBIC  Take 7.5 mg by mouth daily as needed (muscle pain/sleep).      traMADol 50 MG tablet  Commonly known as:  ULTRAM  Take 100 mg by mouth every 4 (four) hours as needed (pain).          Follow-up recommendations:  Activity:  as tolerated Diet:  heart healthy  Patient transferred to [address] per MD Dub Mikes  Comments: Take all medications as prescribed. Keep all follow-up appointments as scheduled.  Do not consume alcohol or use illegal drugs while on prescription medications. Report any adverse effects from your medications to your primary care provider promptly.  In the event of recurrent symptoms or worsening symptoms, call 911, a crisis hotline,  or go to the nearest emergency department for evaluation.   Signed: Oneta Rack, NP 01/17/2016, 4:10 PM I agree with assessment and plan Reymundo Poll. Dub Mikes, M.D.

## 2016-01-17 NOTE — Progress Notes (Signed)
Nursing Shift Note;  D Patient denies any SI/HI/AVH but does admit significant worry and anxiety about "what will happen from here?" and also complains of chronic back pain. Patient does contract for safety. A Nurse providing emotional support, therapeutic commuication, medications and constant observation at all times except when patient in the bathroom. R Patient sleeping much of shift thus far, remains safe on Unit.

## 2016-01-17 NOTE — BHH Group Notes (Signed)
Pt attended AA meeting.  Victor [name], MHT 

## 2016-01-17 NOTE — Tx Team (Signed)
Initial Interdisciplinary Treatment Plan   PATIENT STRESSORS: Health problems Substance abuse   PATIENT STRENGTHS: Capable of independent living Communication skills Motivation for treatment/growth Work skills   PROBLEM LIST: Problem List/Patient Goals Date to be addressed Date deferred Reason deferred Estimated date of resolution  Depression 01/17/2016     Substance abuse 01/17/2016     Suicide Risk 01/17/2016                                          DISCHARGE CRITERIA:  Improved stabilization in mood, thinking, and/or behavior Withdrawal symptoms are absent or subacute and managed without 24-hour nursing intervention  PRELIMINARY DISCHARGE PLAN: Attend 12-step recovery group Return to previous living arrangement  PATIENT/FAMIILY INVOLVEMENT: This treatment plan has been presented to and reviewed with the patient, Victor [name].  The patient and family have been given the opportunity to ask questions and make suggestions.  [place] Mon 01/17/2016, 5:42 PM

## 2016-01-17 NOTE — BHH Counselor (Signed)
Spoke with p. This a.m. And consulted with Alcario Drought, NP regarding pt. tx and disposition. Pt is able to stay overnight for further observation and detox protocol. Pt request residential tx. If pt. Unable to receive placement via residential before d/c patient may be in need of pain management clinic referral  And outpt. Substance Abuse tx. Cultural considerations should be emphasized for future tx, planning.   This Clinical research associate contacted [employer] hotline for available residential programs for pt. . Pt. Referral was sent to [facility], [facility], and [facility].  Pt. Does have [employer] Awaiting notification from facilities, for review of referrals.  Jillayne Witte K. Sherlon Handing, LPC-A, [facility]  Counselor 01/17/2016 2:52 PM

## 2016-01-18 ENCOUNTER — Encounter (HOSPITAL_COMMUNITY): Payer: Self-pay | Admitting: Psychiatry

## 2016-01-18 DIAGNOSIS — F331 Major depressive disorder, recurrent, moderate: Secondary | ICD-10-CM

## 2016-01-18 DIAGNOSIS — F1123 Opioid dependence with withdrawal: Secondary | ICD-10-CM

## 2016-01-18 HISTORY — DX: Major depressive disorder, recurrent, moderate: F33.1

## 2016-01-18 HISTORY — DX: Opioid dependence with withdrawal: F11.23

## 2016-01-18 MED ORDER — DIPHENHYDRAMINE HCL 50 MG PO CAPS
50.0000 mg | ORAL_CAPSULE | Freq: Once | ORAL | Status: AC
  Administered 2016-01-18: 50 mg via ORAL
  Filled 2016-01-18: qty 2
  Filled 2016-01-18: qty 1

## 2016-01-18 MED ORDER — FLUTICASONE PROPIONATE 50 MCG/ACT NA SUSP
2.0000 | Freq: Every day | NASAL | Status: DC
  Administered 2016-01-19 – 2016-01-20 (×2): 2 via NASAL
  Filled 2016-01-18: qty 16

## 2016-01-18 MED ORDER — LORATADINE 10 MG PO TABS
10.0000 mg | ORAL_TABLET | Freq: Every day | ORAL | Status: DC
  Administered 2016-01-18 – 2016-01-20 (×3): 10 mg via ORAL
  Filled 2016-01-18 (×5): qty 1
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
## 2016-01-18 NOTE — Progress Notes (Signed)
Recreation Therapy Notes  Date: 03.01.2017 Time: 9:30am Location: [address] Group Room   Group Topic: Stress Management  Goal Area(s) Addresses:  Patient will actively participate in stress management techniques presented during session.   Behavioral Response: Did not attend.   [name], LRT/CTRS        Victor [name] L 01/18/2016 11:57 AM 

## 2016-01-18 NOTE — Progress Notes (Signed)
D- Patient has had a bright affect for the majority of the shift.  Patient could be heard having loud conversation with a religious theme.  Patient denies SI, HI, and AVH.   A- Assess patient for safety, offer medications as prescribed, engage patient in 1:1 therapeutic staff talks.   R- Patient able to contract for safety

## 2016-01-18 NOTE — BHH Counselor (Signed)
Adult Comprehensive Assessment  Patient ID: Victor [name], male   DOB: [birth year], 50 y.o.   MRN: [id number]  Information Source: Information source: Patient  Current Stressors:  Educational / Learning stressors: 6th grade-"I had to quit and go to work."  Employment / Job issues: works as Administrator.  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  Physical health (include injuries & life threatening diseases): back issues; had back surgery 18 years ago. hand welder-lots of strain on back.  Substance abuse: opiates/pain medication due to chronic back pain.  Bereavement / Loss: none identified.   Living/Environment/Situation:  Living Arrangements: Spouse/significant other Living conditions (as described by patient or guardian): living with spouse, 20 year old and 80 year old child How long has patient lived in current situation?: several years What is atmosphere in current home: Comfortable  Family History:  Marital status: Married Number of Years Married: 9 What types of issues is patient dealing with in the relationship?: pt's wife recently found out about his pain addiction Additional relationship information: n/a  Are you sexually active?: Yes What is your sexual orientation?: heterosexual  Has your sexual activity been affected by drugs, alcohol, medication, or emotional stress?: no.  Does patient have children?: Yes How many children?: 6 How is patient's relationship with their children?: 6 daughters. 4 adult children from previous marriage. 3 yo and 9 yo daughter. very close to his children.   Childhood History:  By whom was/is the patient raised?: Both parents Does patient have siblings?: Yes Did patient suffer any verbal/emotional/physical/sexual abuse as a child?: No Did patient suffer  from severe childhood neglect?: No Has patient ever been sexually abused/assaulted/raped as an adolescent or adult?: No Was the patient ever a victim of a crime or a disaster?: No Witnessed domestic violence?: No Has patient been effected by domestic violence as an adult?: No  Education:  Highest grade of school patient has completed: 6th grade "I had to go to work."  Currently a Consulting civil engineer?: No Name of school: n/a   Employment/Work Situation:   Employment situation: Employed 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, physiological How long has patient been employed?: 24 years  Patient's job has been impacted by current illness: No What is the longest time patient has a held a job?: see above Where was the patient employed at that time?: see above  Has patient ever been in the [employer]?: No Has patient ever served in combat?: No Did You Receive Any Psychiatric Treatment/Services While in Equities trader?: No Are There Guns or Other Weapons in Your Home?: No Are These Comptroller?:  (n/a)  Financial Resources:   Financial resources: Income from employment, Income from spouse, Private insurance Does patient have a representative payee or guardian?: No  Alcohol/Substance Abuse:   What has been your use of drugs/alcohol within the last 12 months?: daily opiate use for the past 12 years. tramadol -increased amount of use over these years due to increasing tolerance levels.  If attempted suicide, did drugs/alcohol play a role in this?: No Alcohol/Substance Abuse Treatment Hx: Denies past history Has alcohol/substance abuse ever caused legal problems?: No  Social Support System:   Patient's Community Support System: Good Describe Community Support System: good support from friends and family is very close  Type of faith/religion: christian  How does patient's faith help to cope with current illness?: prayer/chuch  Leisure/Recreation:   Leisure and Hobbies: spending time  with family and grandson  Strengths/Needs:    Strengths: Chief Executive Officer; good father and husband. Motivated to seek treatment for opiate addiction Needs: coping skills; pain management.   Discharge Plan:   Does patient have access to transportation?: Yes Will patient be returning to same living situation after discharge?: Yes Currently receiving community mental health services: No If no, would patient like referral for services when discharged?: Yes (What county?) [facility]) Does patient have financial barriers related to discharge medications?: No (income and private insurance)  Summary/Recommendations:   Emergency planning/management officer and Recommendations (to be completed by the evaluator): Patient is 50 year old male living in [place], [place]) county with his wife and children. Patient presents to the hospital seeking treatment for opiate abuse/pain pills abuse, depression/anxiety, SI, and for medication stabilization. Patient reports hx of back surgery and chronic back pain that has led to opiate addiction. Patient is currently employed and has a supportive family. Recommendations for patient include: crisis stabilization, therapeutic milieu, encourage group attendance and participation, medication management for mood stabilization, and development of comprehensive mental wellness/sobriety plan.   Smart, Makhari Dovidio LCSW 01/18/2016 9:55 AM

## 2016-01-18 NOTE — Progress Notes (Signed)
D. Pt had been up and visible in milieu this evening, did attend evening group activity. Pt spoke about how he is here for detox from pain pills and spoke about how he last had any on Sunday. Pt has endorsed stomach cramps, generalized discomfort and anxiety and does appear uncomfortable in regard to detox. Pt did receive all evening medications without incident. A. Support provided, medication education given. R. Pt verbalized understanding, safety maintained.

## 2016-01-18 NOTE — Progress Notes (Signed)
Pt attended NA group meeting this evening.   

## 2016-01-18 NOTE — BHH Group Notes (Signed)
[facility] LCSW Aftercare Discharge Planning Group Note   01/18/2016 10:18 AM  Participation Quality:  Appropriate   Mood/Affect:  Appropriate  Depression Rating:  1-2  Anxiety Rating:  6  Thoughts of Suicide:  No Will you contract for safety?   NA  Current AVH:  No  Plan for Discharge/Comments:  Pt reports that he is needing help with opiate addiction. Pt reports chronic pain and back problems and is hoping to get back surgery in order to "fix the problem." He is open to med management and counseling in [place] or [place] on an outpatient basis. CSW assessing for appropriate referrals.   Transportation Means: wife  Supports: wife and 6 daughters  Smart, Herbert Seta LCSW

## 2016-01-18 NOTE — BHH Suicide Risk Assessment (Signed)
[facility] Admission Suicide Risk Assessment   Nursing information obtained from:    Demographic factors:    Current Mental Status:    Loss Factors:    Historical Factors:    Risk Reduction Factors:     Total Time spent with patient: 45 minutes Principal Problem: Depression, major, recurrent, moderate (HCC) Diagnosis:   Patient Active Problem List   Diagnosis Date Noted  . Depression, major, recurrent, moderate (HCC) [F33.1] 01/18/2016  . Opioid dependence with withdrawal [facility]) [F11.23] 01/18/2016   Subjective Data: see admission H and P  Continued Clinical Symptoms:  Alcohol Use Disorder Identification Test Final Score (AUDIT): 0 The "Alcohol Use Disorders Identification Test", Guidelines for Use in Primary Care, Second Edition.  World Science writer [facility]). Score between 0-7:  no or low risk or alcohol related problems. Score between 8-15:  moderate risk of alcohol related problems. Score between 16-19:  high risk of alcohol related problems. Score 20 or above:  warrants further diagnostic evaluation for alcohol dependence and treatment.   CLINICAL FACTORS:   Depression:   Comorbid alcohol abuse/dependence Alcohol/Substance Abuse/Dependencies  Psychiatric Specialty Exam: ROS  Blood pressure 129/82, pulse 94, temperature 97.5 F (36.4 C), temperature source Oral, resp. rate 26, height  (1.676 m), weight 77.111 kg (170 lb), SpO2 99 %.Body mass index is 27.45 kg/(m^2).  COGNITIVE FEATURES THAT CONTRIBUTE TO RISK:  Closed-mindedness, Polarized thinking and Thought constriction (tunnel vision)    SUICIDE RISK:   Mild:  Suicidal ideation of limited frequency, intensity, duration, and specificity.  There are no identifiable plans, no associated intent, mild dysphoria and related symptoms, good self-control (both objective and subjective assessment), few other risk factors, and identifiable protective factors, including available and accessible social support.  PLAN OF CARE:  see admission H and P  I certify that inpatient services furnished can reasonably be expected to improve the patient's condition.   Rachael Fee, MD 01/18/2016, 6:22 PM

## 2016-01-18 NOTE — BHH Group Notes (Signed)
B[facility] LCSW Group Therapy  01/18/2016 3:16 PM  Type of Therapy:  Group Therapy  Participation Level:  Did Not Attend-pt invited. Chose to remain in bed.   Summary of Progress/Problems: Feelings around Diagnosis.   Smart, Victor Twichell LCSW 01/18/2016, 3:16 PM
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
## 2016-01-19 MED ORDER — DIPHENHYDRAMINE HCL 25 MG PO CAPS
50.0000 mg | ORAL_CAPSULE | Freq: Once | ORAL | Status: AC
  Administered 2016-01-19: 50 mg via ORAL
  Filled 2016-01-19 (×2): qty 2

## 2016-01-19 MED ORDER — GABAPENTIN 300 MG PO CAPS
300.0000 mg | ORAL_CAPSULE | Freq: Every day | ORAL | Status: DC
Start: 2016-01-19 — End: 2016-01-20
  Administered 2016-01-19: 300 mg via ORAL
  Filled 2016-01-19 (×3): qty 1

## 2016-01-19 MED ORDER — TRAZODONE HCL 100 MG PO TABS
100.0000 mg | ORAL_TABLET | Freq: Every evening | ORAL | Status: DC | PRN
  Administered 2016-01-19: 100 mg via ORAL
  Filled 2016-01-19 (×4): qty 1

## 2016-01-19 MED ORDER — TRAZODONE HCL 50 MG PO TABS
50.0000 mg | ORAL_TABLET | Freq: Every evening | ORAL | Status: DC | PRN
  Administered 2016-01-19: 50 mg via ORAL
  Filled 2016-01-19 (×6): qty 1

## 2016-01-19 NOTE — Progress Notes (Signed)
[facility] MD Progress Note  01/19/2016 6:15 PM Victor [name]  MRN:  [id number] Subjective:  Victor [name] endorses that his family came to visit last night and he saw his daughters on the other side of the glass as they could not come in. States seeing them and feeling the support of his immediate and extended family has given him more energy and motivation to handle things. He states he needs to take care of his back. He does not want to use these pills anymore Principal Problem: Depression, major, recurrent, moderate (HCC) Diagnosis:   Patient Active Problem List   Diagnosis Date Noted  . Depression, major, recurrent, moderate (HCC) [F33.1] 01/18/2016  . Opioid dependence with withdrawal [facility]) [F11.23] 01/18/2016   Total Time spent with patient: 20 minutes  Past Psychiatric History: see admission H and P  Past Medical History:  Past Medical History  Diagnosis Date  . Shoulder pain   . Back pain     Past Surgical History  Procedure Laterality Date  . Back surgery    . Rotator cuff repair     Family History: History reviewed. No pertinent family history. Family Psychiatric  History: see admission H and P Social History:  History  Alcohol Use No     History  Drug Use  . Yes    Comment: Opiate pills    Social History   Social History  . Marital Status: Legally Separated    Spouse Name: N/A  . Number of Children: N/A  . Years of Education: N/A   Social History Main Topics  . Smoking status: Current Some Day Smoker -- 0.20 packs/day    Types: Cigarettes  . Smokeless tobacco: Never Used  . Alcohol Use: No  . Drug Use: Yes     Comment: Opiate pills  . Sexual Activity: Not Asked   Other Topics Concern  . None   Social History Narrative   Additional Social History:                         Sleep: Poor  Appetite:  Fair  Current Medications: Current Facility-Administered Medications  Medication Dose Route Frequency Provider Last Rate Last Dose  . acetaminophen  (TYLENOL) tablet 650 mg  650 mg Oral Q6H PRN Thermon Leyland, NP   650 mg at 01/18/16 4098  . alum & mag hydroxide-simeth (MAALOX/MYLANTA) 200-200-20 MG/5ML suspension 30 mL  30 mL Oral Q4H PRN Thermon Leyland, NP      . cloNIDine (CATAPRES) tablet 0.1 mg  0.1 mg Oral BH-qamhs Thermon Leyland, NP   0.1 mg at 01/19/16 0736   Followed by  . [START ON 01/20/2016] cloNIDine (CATAPRES) tablet 0.1 mg  0.1 mg Oral QAC breakfast Thermon Leyland, NP      . dicyclomine (BENTYL) tablet 20 mg  20 mg Oral Q6H PRN Thermon Leyland, NP   20 mg at 01/18/16 2128  . fluticasone (FLONASE) 50 MCG/ACT nasal spray 2 spray  2 spray Each Nare Daily Kerry Hough, PA-C   2 spray at 01/19/16 0737  . hydrOXYzine (ATARAX/VISTARIL) tablet 25 mg  25 mg Oral Q6H PRN Thermon Leyland, NP   25 mg at 01/18/16 2128  . loperamide (IMODIUM) capsule 2-4 mg  2-4 mg Oral PRN Thermon Leyland, NP   4 mg at 01/18/16 1337  . loratadine (CLARITIN) tablet 10 mg  10 mg Oral Daily Rachael Fee, MD   10 mg at 01/19/16 0736  .  magnesium hydroxide (MILK OF MAGNESIA) suspension 30 mL  30 mL Oral Daily PRN Thermon Leyland, NP      . methocarbamol (ROBAXIN) tablet 500 mg  500 mg Oral Q8H PRN Thermon Leyland, NP   500 mg at 01/18/16 2128  . naproxen (NAPROSYN) tablet 500 mg  500 mg Oral BID PRN Thermon Leyland, NP   500 mg at 01/19/16 1627  . nicotine (NICODERM CQ - dosed in mg/24 hours) patch 21 mg  21 mg Transdermal Daily Rachael Fee, MD   21 mg at 01/19/16 0741  . ondansetron (ZOFRAN-ODT) disintegrating tablet 4 mg  4 mg Oral Q6H PRN Thermon Leyland, NP      . traZODone (DESYREL) tablet 50 mg  50 mg Oral QHS,MR X 1 Kerry Hough, PA-C   50 mg at 01/19/16 0121    Lab Results: No results found for this or any previous visit (from the past 48 hour(s)).  Blood Alcohol level:  Lab Results  Component Value Date   ETH <5 01/15/2016    Physical Findings: AIMS: Facial and Oral Movements Muscles of Facial Expression: None, normal Lips and Perioral Area: None,  normal Jaw: None, normal Tongue: None, normal,Extremity Movements Upper (arms, wrists, hands, fingers): None, normal Lower (legs, knees, ankles, toes): None, normal, Trunk Movements Neck, shoulders, hips: None, normal, Overall Severity Severity of abnormal movements (highest score from questions above): None, normal Incapacitation due to abnormal movements: None, normal Patient's awareness of abnormal movements (rate only patient's report): No Awareness, Dental Status Current problems with teeth and/or dentures?: No Does patient usually wear dentures?: No  CIWA:  CIWA-Ar Total: 6 COWS:  COWS Total Score: 7  Musculoskeletal: Strength & Muscle Tone: within normal limits Gait & Station: normal Patient leans: normal  Psychiatric Specialty Exam: Review of Systems  Constitutional: Negative.   HENT: Negative.   Eyes: Negative.   Respiratory: Negative.   Cardiovascular: Negative.   Gastrointestinal: Negative.   Genitourinary: Negative.   Musculoskeletal: Positive for back pain and joint pain.  Skin: Negative.   Neurological: Negative.   Endo/Heme/Allergies: Negative.   Psychiatric/Behavioral: Positive for substance abuse. The patient is nervous/anxious.     Blood pressure 130/83, pulse 66, temperature 98.4 F (36.9 C), temperature source Oral, resp. rate 18, height 5\' 6"  (1.676 m), weight 77.111 kg (170 lb), SpO2 99 %.Body mass index is 27.45 kg/(m^2).  General Appearance: Fairly Groomed  Patent attorney::  Fair  Speech:  Clear and Coherent  Volume:  Normal  Mood:  anxious   Affect:  Appropriate  Thought Process:  Coherent and Goal Directed  Orientation:  Full (Time, Place, and Person)  Thought Content:  symptoms events worries concerns  Suicidal Thoughts:  No  Homicidal Thoughts:  No  Memory:  Immediate;   Fair Recent;   Fair Remote;   Fair  Judgement:  Fair  Insight:  Present  Psychomotor Activity:  Restlessness  Concentration:  Fair  Recall:  [employer] of Knowledge:Fair   Language: Fair  Akathisia:  No  Handed:  Right  AIMS (if indicated):     Assets:  Desire for Improvement Housing Social Support Vocational/Educational  ADL's:  Intact  Cognition: WNL  Sleep:  Number of Hours: 3.75   Treatment Plan Summary: Daily contact with patient to assess and evaluate symptoms and progress in treatment Supportive approach/coping skills Opioid dependence; continue the clonidine detox protocol/work a relapse prevention plan Depression; will hold the use of an antidepressant as his depression seem  to have a substance induced component that seems to be improving Insomnia; will increase the Trazodone to 100 mg HS PRN Tonyia Marschall A, MD 01/19/2016, 6:15 PM

## 2016-01-19 NOTE — Progress Notes (Signed)
D-  Patient has been complaining of allergies this shift.  Patient has engaged patient in groups, was compliant with medications and talked about returning home and spending time with his family.  A- Assess patient for safety, offer medications as prescribed, engage patient in 1:1 therapeutic staff talks,   R-  Offer medications as prescribed,

## 2016-01-19 NOTE — Progress Notes (Signed)
[facility] Group Notes:  (Nursing/MHT/Case Management/Adjunct)  Date:  01/19/2016  Time:  2100  Type of Therapy:  wrap up group  Participation Level:  Active  Participation Quality:  Appropriate, Attentive, Sharing and Supportive  Affect:  Appropriate and Excited  Cognitive:  Appropriate  Insight:  Appropriate  Engagement in Group:  Engaged  Modes of Intervention:  Clarification, Education and Support  Summary of Progress/Problems:Pt reported waking up feeling energized after only having two hours of sleep. Pt attributes his energy to being clean and sober. Pt shared that he would like to help others that are in his situation and let them know there is help for them. Pt reports that he is going to do what his doctor instructs him to do and is eager and happy to get home to his 6 daughters.   Marcille Buffy 01/19/2016, 10:11 PM

## 2016-01-19 NOTE — [facility] Group Notes (Addendum)
Patient attended and participated in nursing education regarding positive lifestyle and leisure choices.   

## 2016-01-19 NOTE — [facility] Group Notes (Signed)
[facility] LCSW Group Therapy  01/19/2016 12:42 PM  Type of Therapy:  Group Therapy  Participation Level:  Active  Participation Quality:  Attentive  Affect:  Appropriate  Cognitive:  Alert and Oriented  Insight:  Engaged  Engagement in Therapy:  Engaged  Modes of Intervention:  Confrontation, Discussion, Education, Exploration, Problem-solving, Rapport Building, Socialization and Support  Summary of Progress/Problems: Emotion Regulation: This group focused on both positive and negative emotion identification and allowed group members to process ways to identify feelings, regulate negative emotions, and find healthy ways to manage internal/external emotions. Group members were asked to reflect on a time when their reaction to an emotion led to a negative outcome and explored how alternative responses using emotion regulation would have benefited them. Group members were also asked to discuss a time when emotion regulation was utilized when a negative emotion was experienced. Victor [name] was attentive and engaged during today's processing group and shared that he struggles with guilt. "I've been lying to my family about my drug abuse for along time. It feels good to be honest." Victor [name] continues to show progress in the group setting and improving insight.   Smart, Darryle Dennie LCSW 01/19/2016, 12:42 PM

## 2016-01-19 NOTE — Progress Notes (Signed)
D. Pt had been up and visible in milieu this evening, did attend evening group activity. Pt has endorsed on-going withdrawal symptoms and has appeared uncomfortable throughout the evening. Pt did report that he feels medications are helping and also reports feeling restless and fidgety. A. Support provided, education given. R. Pt verbalized understanding, safety maintained.

## 2016-01-20 MED ORDER — NICOTINE 21 MG/24HR TD PT24: 21.0000 mg | MEDICATED_PATCH | Freq: Every day | TRANSDERMAL | Status: DC

## 2016-01-20 MED ORDER — CLONIDINE HCL 0.1 MG PO TABS: 0.1000 mg | ORAL_TABLET | Freq: Every day | ORAL | Status: DC

## 2016-01-20 MED ORDER — TRAZODONE HCL 100 MG PO TABS: 100.0000 mg | ORAL_TABLET | Freq: Every evening | ORAL | Status: DC | PRN

## 2016-01-20 MED ORDER — GABAPENTIN 300 MG PO CAPS: 300.0000 mg | ORAL_CAPSULE | Freq: Every day | ORAL | Status: DC

## 2016-01-20 MED ORDER — FLUTICASONE PROPIONATE 50 MCG/ACT NA SUSP: 2.0000 | Freq: Every day | NASAL | Status: DC

## 2016-01-20 MED ORDER — LORATADINE 10 MG PO TABS: 10.0000 mg | ORAL_TABLET | Freq: Every day | ORAL | Status: DC

## 2016-01-20 MED ORDER — HYDROXYZINE HCL 25 MG PO TABS: 25.0000 mg | ORAL_TABLET | Freq: Four times a day (QID) | ORAL | Status: DC | PRN

## 2016-01-20 MED ORDER — ACETAMINOPHEN 500 MG PO TABS: 1000.0000 mg | ORAL_TABLET | Freq: Four times a day (QID) | ORAL | Status: DC | PRN

## 2016-01-20 NOTE — [facility] Suicide Risk Assessment (Signed)
[facility] INPATIENT:  Family/Significant Other Suicide Prevention Education  Suicide Prevention Education:  Contact Attempts: Victor SilenceVictoria [name] (pt's daughter) [phone] has been identified by the patient as the family member/significant other with whom the patient will be residing, and identified as the person(s) who will aid the patient in the event of a mental health crisis.  With written consent from the patient, two attempts were made to provide suicide prevention education, prior to and/or following the patient's discharge.  We were unsuccessful in providing suicide prevention education.  A suicide education pamphlet was given to the patient to share with family/significant other.  Date and time of first attempt: 01/19/16 at 4:15PM (message left requesting call back)  Date and time of second attempt: 01/20/16 at 9:15AM  (message left requesting call back)   Smart, Shalin Vonbargen LCSW 01/20/2016, 9:19 AM

## 2016-01-20 NOTE — Progress Notes (Signed)
D:  Patient is A&O, denies SI/HI and A/V hallucinations. Patient states he is not having any withdrawal symptoms and feels "ready to go home". Patient denies anxiety, depression and hopelessness. Patient is pleasant, cooperative, and interacts appropriately. A: Patient given medications as scheduled and prn. Continue q15 minute checks continued for safety. R: Patient remains safe. Patient verbalized understanding to let staff know if he no longer feels he can contract.  Airam Heidecker, Wyman SongsterAngela Marie, RN

## 2016-01-20 NOTE — Progress Notes (Signed)
D: patient verbalized understanding of his discharge instructions and follow up, no further questions at this time. Patient is being discharged home with his wife at 11am. All belongings will be returned to the patient.  A: Patient will continue to be on q15 minute checks for safety. Patient given 5 scripts and his AVS. R: Patient remains safe.   Bindu Docter, Wyman SongsterAngela Marie, RN

## 2016-01-20 NOTE — Discharge Summary (Signed)
Physician Discharge Summary Note  Patient:  Victor PacerMartin [name] is an 50 y.o., male MRN:  [id number] DOB:  [birth year] Patient phone:  [phone] (home)  Patient address[place]   [address] [place] [address],  Total Time spent with patient: Greater than 30 minutes  Date of Admission:  01/16/2016  Date of Discharge: 01/20/2016  Reason for Admission: Opioid addiction  Principal Problem: Depression, major, recurrent, moderate (HCC)  Discharge Diagnoses: Patient Active Problem List   Diagnosis Date Noted  . Depression, major, recurrent, moderate (HCC) [F33.1] 01/18/2016  . Opioid dependence with withdrawal [facility](HCC) [F11.23] 01/18/2016   Past Psychiatric History: Hx. Opioid detox, Major depression  Past Medical History:  Past Medical History  Diagnosis Date  . Shoulder pain   . Back pain     Past Surgical History  Procedure Laterality Date  . Back surgery    . Rotator cuff repair     Family History: History reviewed. No pertinent family history.  Family Psychiatric  See H&P  Social History:  History  Alcohol Use No     History  Drug Use  . Yes    Comment: Opiate pills    Social History   Social History  . Marital Status: Legally Separated    Spouse Name: N/A  . Number of Children: N/A  . Years of Education: N/A   Social History Main Topics  . Smoking status: Current Some Day Smoker -- 0.20 packs/day    Types: Cigarettes  . Smokeless tobacco: Never Used  . Alcohol Use: No  . Drug Use: Yes     Comment: Opiate pills  . Sexual Activity: Not Asked   Other Topics Concern  . None   Social History Narrative   Hospital Course: 50 Y/o male who states that since he had surgery he was placed onUltram. Every now & then he was given other opioids. States to be able to work and function he started  taking more. Had been on Tramadol for 12 years up to 4 Q 4, up to 20 a day. And on Hydrocodone Oxycodone 8-10 per day for the last 2 years. He states he wants off them. He has not  been succesful trying to come off on his own.  [name] was admitted to the unit with his UDS test results positive for opioid. He did admit to having developed addiction to opioid from being on this narcotic for 12 years, prescribed. He stated that he has tried unsuccessfully on his own to come off of opioid. He requested & was in need of opioid detoxification treatments. After admission assessment/evaluation, his presenting symptoms were detected & identified. The medication regimen targeting those symptoms were discussed & initiated. He received Clonidine detoxification treatment protocols to combat the withdrawal symptoms of opioid. He was also medicated & discharged on; Gabapentin 300 mg for agitation/substance withdrawal syndrome, Hydroxyzine 25 mg prn for anxiety & Trazodone 100 mg for insomnia. He was enrolled & participated in the group counseling sessions being offered & held on this unit. He learned coping skills that should help him further to cope better & manage his substance abuse issues after discharge. He received other medication regimen for the other medical ailments presented. He tolerated his treatment regimen without any adverse effects or reactions.  [name] has completed detox treatment & his mood is stable. This is evidenced by his reports of improved mood, absence of suicidal ideations & or substance withdrawal symptoms. He is currently being discharged to continue further substance abuse treatment on  outpatient basis as noted below. Upon discharge, he adamantly denies any SIHI, AVH, delusional thoughts, paranoia or substance withdrawal symptoms. He left [facility] with all personal belongings in no apparent distress. Transportation per wife.  Physical Findings: AIMS: Facial and Oral Movements Muscles of Facial Expression: None, normal Lips and Perioral Area: None, normal Jaw: None, normal Tongue: None, normal,Extremity Movements Upper (arms, wrists, hands, fingers): None, normal Lower  (legs, knees, ankles, toes): None, normal, Trunk Movements Neck, shoulders, hips: None, normal, Overall Severity Severity of abnormal movements (highest score from questions above): None, normal Incapacitation due to abnormal movements: None, normal Patient's awareness of abnormal movements (rate only patient's report): No Awareness, Dental Status Current problems with teeth and/or dentures?: No Does patient usually wear dentures?: No  CIWA:  CIWA-Ar Total: 6 COWS:  COWS Total Score: 1  Musculoskeletal: Strength & Muscle Tone: within normal limits Gait & Station: normal Patient leans: N/A  Psychiatric Specialty Exam: Review of Systems  Constitutional: Negative.   HENT: Negative.   Eyes: Negative.   Respiratory: Negative.   Cardiovascular: Negative.   Gastrointestinal: Negative.   Genitourinary: Negative.   Musculoskeletal: Negative.   Skin: Negative.   Neurological: Negative.   Endo/Heme/Allergies: Negative.   Psychiatric/Behavioral: Positive for depression (Stable) and substance abuse (Opioid addiction). Negative for suicidal ideas, hallucinations and memory loss. The patient has insomnia (Stable). The patient is not nervous/anxious.   All other systems reviewed and are negative.   Blood pressure 147/101, pulse 94, temperature 97.3 F (36.3 C), temperature source Oral, resp. rate 20, height  (1.676 m), weight 77.111 kg (170 lb), SpO2 99 %.Body mass index is 27.45 kg/(m^2).  See Md's SRA   Have you used any form of tobacco in the last 30 days? (Cigarettes, Smokeless Tobacco, Cigars, and/or Pipes): Yes  Has this patient used any form of tobacco in the last 30 days? (Cigarettes, Smokeless Tobacco, Cigars, and/or Pipes) Yes, Yes, A prescription for an FDA-approved tobacco cessation medication was offered at discharge and the patient refused  Blood Alcohol level:  Lab Results  Component Value Date   ETH <5 01/15/2016   Metabolic Disorder Labs:  No results found for:  HGBA1C, MPG No results found for: PROLACTIN No results found for: CHOL, TRIG, HDL, CHOLHDL, VLDL, LDLCALC  See Psychiatric Specialty Exam and Suicide Risk Assessment completed by Attending Physician prior to discharge.  Discharge destination:  Home  Is patient on multiple antipsychotic therapies at discharge:  No   Has Patient had three or more failed trials of antipsychotic monotherapy by history:  No  Recommended Plan for Multiple Antipsychotic Therapies: NA    Medication List    STOP taking these medications        atenolol 25 MG tablet  Commonly known as:  TENORMIN     meloxicam 7.5 MG tablet  Commonly known as:  MOBIC     traMADol 50 MG tablet  Commonly known as:  ULTRAM      TAKE these medications      Indication   acetaminophen 500 MG tablet  Commonly known as:  TYLENOL  Take 2 tablets (1,000 mg total) by mouth every 6 (six) hours as needed for headache (Headache pain).   Indication:  Headaches     cloNIDine 0.1 MG tablet  Commonly known as:  CATAPRES  Take 1 tablet (0.1 mg total) by mouth daily before breakfast. For high blood pressure   Indication:  Hypertensive Urgency     fluticasone 50 MCG/ACT nasal spray  Commonly  known as:  FLONASE  Place 2 sprays into both nostrils daily. For allergies   Indication:  Allergic Rhinitis     gabapentin 300 MG capsule  Commonly known as:  NEURONTIN  Take 1 capsule (300 mg total) by mouth at bedtime. For agitation   Indication:  Agitation     hydrOXYzine 25 MG tablet  Commonly known as:  ATARAX/VISTARIL  Take 1 tablet (25 mg total) by mouth every 6 (six) hours as needed for anxiety.   Indication:  Anxiety Neurosis     loratadine 10 MG tablet  Commonly known as:  CLARITIN  Take 1 tablet (10 mg total) by mouth daily. (May purchase from over the counter at the pharmacy): For allergies   Indication:  Hayfever     nicotine 21 mg/24hr patch  Commonly known as:  NICODERM CQ - dosed in mg/24 hours  Place 1 patch (21 mg  total) onto the skin daily. For smoking cessation   Indication:  Nicotine Addiction     traZODone 100 MG tablet  Commonly known as:  DESYREL  Take 1 tablet (100 mg total) by mouth at bedtime and may repeat dose one time if needed. For sleep   Indication:  Trouble Sleeping       Follow-up Information    Follow up with [facility] (Medication Management & Counseling).   Why:  The office requests that you call at discharge to arrange appt time and date. Deposit required prior to making appts. ($150 for new patients).   Contact information:   [address] [place], [place] [address] Phone: [phone] Fax: [phone]      Follow-up recommendations: Activity:  As tolerated Diet: As recommended by your primary care doctor. Keep all scheduled follow-up appointments as recommended.   Comments: Take all your medications as prescribed by your mental healthcare provider. Report any adverse effects and or reactions from your medicines to your outpatient provider promptly. Patient is instructed and cautioned to not engage in alcohol and or illegal drug use while on prescription medicines. In the event of worsening symptoms, patient is instructed to call the crisis hotline, 911 and or go to the nearest ED for appropriate evaluation and treatment of symptoms. Follow-up with your primary care provider for your other medical issues, concerns and or health care needs.   Signed: Sanjuana Kava, NP, PMHNP, FNP-BC 01/20/2016, 1:29 PM  I personally assessed the patient and formulated the plan Madie Reno A. Dub Mikes, M.D.

## 2016-01-20 NOTE — Progress Notes (Signed)
Pt has been observed in the dayroom most of the evening watching TV and talking with his peers.  He has been pleasant and cooperative with staff.  Pt reports his withdrawal symptoms have been minimal, but his pain level remains high.  He states the pain is worse in his L shoulder, but also in his back.  He says he is tired of depending on pain meds.  He tells this Clinical research associatewriter that he is supposed to be discharged tomorrow, and will f/u with a MD who will help him with his pain as he feels he would be fine if he could get control of his pain.  He wants to be clean of the pain meds for himself and his family.  Pt denies SI/HI/AVH.  Support and encouragement offered.  Discharge plans are in process.  Safety maintained with q15 minute checks.

## 2016-01-20 NOTE — Progress Notes (Signed)
  [facility] Discharge Plan :  Will you be returning to the same living situation after discharge:  Yes,  home with family At discharge, do you have transportation home?: Yes,  wife Do you have the ability to pay for your medications: Yes,  [employer] private insurance  Release of information consent forms completed and submitted to medical records by CSW.   Patient to Follow up at: Follow-up Information    Follow up with [facility] ([facility] Management [facility] Counsel[facility]).   Why:  The office requests that you call at discharge to arrange appt time and date. Deposit required prior to making appts. ($150 for new patients).   Contact information:   [address] [place], [place] [address] Phone: [phone] Fax: [phone]       Next level of care provider has [facility] to [facility] Link:no  Safety Planning and Suicide Prevention discussed: Yes,  SPE completed with pt; contact attempts made with pt's daugther. Pt provided with SPI pamphlet and mobvile crisis information.  Have you used any form of tobacco in the last 30 days? (Cigarettes, Smokeless Tobacco, Cigars, and/or Pipes): Yes  Has patient been referred to the Quitline?: Patient refused referral  Patient has been referred for addiction treatment: Yes (o/p)   Smart, Damondre Pfeifle LCSW 01/20/2016, 9:22 AM
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
## 2016-01-20 NOTE — Progress Notes (Signed)
Recreation Therapy Notes  Date: 03.03.2017 Time: 9:30am Location: [address] Group Room   Group Topic: Stress Management  Goal Area(s) Addresses:  Patient will actively participate in stress management techniques presented during session.   Behavioral Response: Engaged, Attentive, Appropriate   Intervention: Stress management techniques  Activity :  Deep Breathing and Guided Imagery. LRT provided education, instruction and demonstration on practice of Deep Breathing and Guided Imagery. Patient was asked to participate in technique introduced during session.   Education:  Stress Management, Discharge Planning.   Education Outcome: Acknowledges education  Clinical Observations/Feedback: Patient actively engaged in technique introduced, expressed no concerns and demonstrated ability to practice independently post d/c.   Trustin Chapa L Jermar Colter, LRT/CTRS  Levi Crass L 01/20/2016 10:18 AM 

## 2016-01-20 NOTE — [facility] Suicide Risk Assessment (Signed)
[facility]   Principal Problem: Depression, major, recurrent, moderate (HCC) Discharge Diagnoses:  Patient Active Problem List   Diagnosis Date Noted  . Depression, major, recurrent, moderate (HCC) [F33.1] 01/18/2016  . Opioid dependence with withdrawal [facility](HCC) [F11.23] 01/18/2016    Total Time spent with patient: 20 minutes  Musculoskeletal: Strength & Muscle Tone: within normal limits Gait & Station: normal Patient leans: normal  Psychiatric Specialty Exam: Review of Systems  Constitutional: Negative.   HENT: Negative.   Eyes: Negative.   Respiratory: Negative.   Cardiovascular: Negative.   Gastrointestinal: Negative.   Genitourinary: Negative.   Musculoskeletal: Positive for back pain and joint pain.  Skin: Negative.   Neurological: Negative.   Endo/Heme/Allergies: Negative.   Psychiatric/Behavioral: Positive for substance abuse.    Blood pressure 147/101, pulse 94, temperature 97.3 F (36.3 C), temperature source Oral, resp. rate 20, height 5\' 6"  (1.676 m), weight 77.111 kg (170 lb), SpO2 99 %.Body mass index is 27.45 kg/(m^2).  General Appearance: Fairly Groomed  Patent attorneyye Contact::  Fair  Speech:  Clear and Coherent409  Volume:  Normal  Mood:  Euthymic  Affect:  Appropriate  Thought Process:  Coherent and Goal Directed  Orientation:  Full (Time, Place, and Person)  Thought Content:  plans as he moves on, relapse prevention plan  Suicidal Thoughts:  No  Homicidal Thoughts:  No  Memory:  Immediate;   Fair Recent;   Fair Remote;   Fair  Judgement:  Fair  Insight:  Present  Psychomotor Activity:  Normal  Concentration:  Fair  Recall:  FiservFair  Fund of Knowledge:Fair  Language: Fair  Akathisia:  No  Handed:  Right  AIMS (if indicated):     Assets:  Desire for Improvement Housing Social Support Vocational/Educational  Sleep:  Number of Hours: 6  Cognition: WNL  ADL's:  Intact  In full contact with reality. There are no active S/S of  withdrawal. There are no active SI plans or intent. He is going to pursue further evaluation and treatment for his back pain Mental Status Per Nursing Assessment::   On Admission:     Demographic Factors:  Male  Loss Factors: Decline in physical health  Historical Factors: none identified  Risk Reduction Factors:   Sense of responsibility to family, Employed, Living with another person, especially a relative and Positive social support  Continued Clinical Symptoms:  Depression:   Comorbid alcohol abuse/dependence Alcohol/Substance Abuse/Dependencies,chronic pain  Cognitive Features That Contribute To Risk:  None    Suicide Risk:  Minimal: No identifiable suicidal ideation.  Patients presenting with no risk factors but with morbid ruminations; may be classified as minimal risk based on the severity of the depressive symptoms  Follow-up Information    Follow up with [facility] ([facility] Management & Counseling).   Why:  The office requests that you call at discharge to arrange appt time and date. Deposit required prior to making appts. ($150 for new patients).   Contact information:   [address] [place], [place] [address] Phone: [phone] Fax: [phone]       Plan Of Care/Follow-up recommendations:  Activity:  as tolerated Diet:  Regular Follow up as above Ira Dougher A, MD 01/20/2016, 10:02 AM

## 2016-04-30 ENCOUNTER — Other Ambulatory Visit: Payer: Self-pay | Admitting: Specialist

## 2016-04-30 DIAGNOSIS — M5116 Intervertebral disc disorders with radiculopathy, lumbar region: Secondary | ICD-10-CM

## 2016-05-07 ENCOUNTER — Ambulatory Visit
Admission: RE | Admit: 2016-05-07 | Discharge: 2016-05-07 | Disposition: A | Discharge status: Home / Self Care | Payer: Self-pay | Source: Ambulatory Visit | Attending: Specialist | Admitting: Specialist

## 2016-05-07 ENCOUNTER — Ambulatory Visit
Admission: RE | Admit: 2016-05-07 | Discharge: 2016-05-07 | Disposition: A | Discharge status: Home / Self Care | Payer: Worker's Compensation | Source: Ambulatory Visit | Attending: Specialist | Admitting: Specialist

## 2016-05-07 DIAGNOSIS — M5116 Intervertebral disc disorders with radiculopathy, lumbar region: Secondary | ICD-10-CM

## 2016-05-07 MED ORDER — IOPAMIDOL (ISOVUE-M 200) INJECTION 41%: 15.0000 mL | Freq: Once | INTRAMUSCULAR | Status: DC

## 2016-05-07 MED ORDER — DIAZEPAM 5 MG PO TABS
10.0000 mg | ORAL_TABLET | Freq: Once | ORAL | Status: AC
  Administered 2016-05-07: 10 mg via ORAL

## 2016-05-07 NOTE — Discharge Instructions (Signed)

## 2016-06-12 ENCOUNTER — Ambulatory Visit: Payer: Self-pay | Admitting: Orthopedic Surgery

## 2016-06-21 ENCOUNTER — Ambulatory Visit: Payer: Self-pay | Admitting: Orthopedic Surgery

## 2016-06-21 NOTE — H&P (Signed)
Victor [name] is an 50 y.o. male.   Chief Complaint: back and R leg pain HPI: The patient is a 50 year old male who presents today for follow up of their back. The patient is being followed for their low back symptoms. They are now 9 week(s) out from recent injury. The patient states that he fell. The patient is 13 years and 8 months out from original injury (DOI 09/15/2002). Symptoms reported today include: pain. Current treatment includes: activity modification and NSAIDs. The following medication has been used for pain control: Ultram and Mobic. The patient presents today following CT/Myelogram. Note for "Follow-up back": The patient currently has light duty restrictions of no lifting over 10 lbs, no bendin, stooping, squatting greater than 2 times per hour and no proloned standing or sitting greater than 60 minutes. Victor [name].  Victor [name] follows up with his case manager, Victor [name]. Victor [name] reports he has predominant leg pain that radiates down into the top of his foot, worse with any activity. Again, he is nine weeks status post a re-injury where he fell. He is having less back pain than he has buttock and leg pain. He was treated with a history of a lumbar decompression. He has intermittent mechanical back pain.  Past Medical History:  Diagnosis Date  . Back pain   . Shoulder pain     Past Surgical History:  Procedure Laterality Date  . BACK SURGERY    . ROTATOR CUFF REPAIR      No family history on file. Social History:  reports that he has been smoking Cigarettes.  He has been smoking about 0.20 packs per day. He has never used smokeless tobacco. He reports that he uses drugs. He reports that he does not drink alcohol.  Allergies: No Known Allergies   (Not in a hospital admission)  No results found for this or any previous visit (from the past 48 hour(s)). No results found.  Review of Systems  Constitutional: Negative.   HENT: Negative.   Eyes:  Negative.   Respiratory: Negative.   Cardiovascular: Negative.   Gastrointestinal: Negative.   Genitourinary: Negative.   Musculoskeletal: Positive for back pain.  Skin: Negative.   Neurological: Positive for sensory change and focal weakness.  Psychiatric/Behavioral: Negative.     There were no vitals taken for this visit. Physical Exam  Constitutional: He is oriented to person, place, and time. He appears well-developed.  HENT:  Head: Normocephalic.  Eyes: Pupils are equal, round, and reactive to light.  Neck: Normal range of motion.  Cardiovascular: Normal rate.   Respiratory: Effort normal.  GI: Soft.  Musculoskeletal:  On exam, he is in moderate-to-severe distress. Mood and affect is appropriate. He walks with an antalgic gait. He is tender in the right proximal gluteus. His straight leg raise produces buttock, thigh and calf pain on the right, negative on the left. He has 4+/5 EHL on the right compared to the left. He reports altered sensation at the L5 dermatome.  Risks and benefits of the anterior lumbar fusion were discussed with the patient including bleeding, infection, damage to neurovascular structures, nonunion, hardware failure, need for repositioning, autograft, allograft, bone graft, DVT, PE, anesthetic complications, extended time to union as well as the inability to perform the procedure. We utilized illustrated drawings to discuss the implant.  He has an area of subcutaneous or superficial tenderness in the right proximal gluteus near the cluneal nerves.  Neurological: He is alert and oriented to person, place, and  time.  Skin: Skin is warm and dry.    His x-rays demonstrates moderately severe disk degeneration at 4-5, no instability in flexion and extension. Hips are unremarkable.  Myelogram demonstrates a small, recurrent disk herniation at 4-5 cephalad with effacement of the 5 root.   Assessment/Plan 1. L5 radiculopathy, refractory, nine weeks status post  re-injury at work with myotomal weakness dermatome with dysesthesias despite rest, activity modification, epidural and physical therapy. 2. History of a lumbar decompression with moderate-to-severe disk degeneration at 4-5 without instability. He reports he does not have disabling back pain.  We had an extensive discussion concerning current pathology, relevant anatomy and treatment options. At this point in time, he has got the option of living with his symptoms and pain management and activity modification which he reports he is unable to do so. We; therefore, discussed surgical options: 1. Revision lumbar decompression at 4-5 and discectomy versus revision lumbar decompression, discectomy and fusion. Given his minimal back pain and no instability, he has an open foramen. We discussed proceeding with a revision lumbar decompression only, microscopic.  I had an extensive discussion of the risks and benefits of the lumbar decompression with the patient including bleeding, infection, damage to neurovascular structures, epidural fibrosis, CSF leak requiring repair. We also discussed increase in pain, adjacent segment disease, recurrent disc herniation, need for future surgery including repeat decompression and/or fusion. We also discussed risks of postoperative hematoma, paralysis, anesthetic complications including DVT, PE, death, cardiopulmonary dysfunction. In addition, the perioperative and postoperative courses were discussed in detail including the rehabilitative time and return to functional activity and work. I provided the patient with an illustrated handout and utilized the appropriate surgical models.  We discussed overnight in the hospital, two weeks for suture removal and physical therapy for six weeks followed by potential work conditioning from six to 10 weeks from the six-week point to the 10-week point. He is a Psychologist, occupational, light duty at six weeks.  In terms of challenges of returning to work, we  discussed a table that could be fabricated such that he can lean on to take the pressure off his lower back. 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 We discussed again the risks of recurrent disk herniation, need for fusion in the future. Increased risks of epidural and CSF leak with revision surgery. No history of MRSA or DVT. He has asked for different pain medicines and I gave him a prescription for Robaxin to be taken as directed as well as Norco. Terocin patch maybe beneficial as well up to every 12 hours to the area of the cluneal nerves, where he has some hypersensitivity. He has a lot of derm-type patch. I will discuss this in front of the patient with Victor Pickle, case manager.  In the interim, continue light duty. No lifting over 10 pounds, prolonged standing and sitting and repetitive bending. Any further questions. he is to contact me prior to surgical scheduling.  Plan revision microlumbar decompression L4-5 right  Dorothy Spark., PA-C for Dr. Shelle Iron 06/21/2016, 1:33 PM

## 2016-07-04 ENCOUNTER — Encounter (HOSPITAL_COMMUNITY): Payer: Self-pay

## 2016-07-04 ENCOUNTER — Encounter (HOSPITAL_COMMUNITY)
Admission: RE | Admit: 2016-07-04 | Discharge: 2016-07-04 | Disposition: A | Discharge status: Home / Self Care | Payer: Worker's Compensation | Source: Ambulatory Visit | Attending: Specialist | Admitting: Specialist

## 2016-07-04 ENCOUNTER — Ambulatory Visit (HOSPITAL_COMMUNITY)
Admission: RE | Admit: 2016-07-04 | Discharge: 2016-07-04 | Disposition: A | Discharge status: Home / Self Care | Payer: Worker's Compensation | Source: Ambulatory Visit | Attending: Orthopedic Surgery | Admitting: Orthopedic Surgery

## 2016-07-04 DIAGNOSIS — M5116 Intervertebral disc disorders with radiculopathy, lumbar region: Secondary | ICD-10-CM | POA: Diagnosis not present

## 2016-07-04 DIAGNOSIS — M5136 Other intervertebral disc degeneration, lumbar region: Secondary | ICD-10-CM | POA: Insufficient documentation

## 2016-07-04 DIAGNOSIS — Z01812 Encounter for preprocedural laboratory examination: Secondary | ICD-10-CM | POA: Diagnosis present

## 2016-07-04 DIAGNOSIS — F17210 Nicotine dependence, cigarettes, uncomplicated: Secondary | ICD-10-CM | POA: Diagnosis not present

## 2016-07-04 DIAGNOSIS — I10 Essential (primary) hypertension: Secondary | ICD-10-CM | POA: Diagnosis not present

## 2016-07-04 DIAGNOSIS — Z01818 Encounter for other preprocedural examination: Secondary | ICD-10-CM | POA: Diagnosis not present

## 2016-07-04 DIAGNOSIS — M5126 Other intervertebral disc displacement, lumbar region: Secondary | ICD-10-CM

## 2016-07-04 DIAGNOSIS — Z01810 Encounter for preprocedural cardiovascular examination: Secondary | ICD-10-CM | POA: Diagnosis not present

## 2016-07-04 DIAGNOSIS — Z79899 Other long term (current) drug therapy: Secondary | ICD-10-CM | POA: Diagnosis not present

## 2016-07-04 HISTORY — DX: Essential (primary) hypertension: I10

## 2016-07-04 LAB — BASIC METABOLIC PANEL
ANION GAP: 7 (ref 5–15)
BUN: 16 mg/dL (ref 6–20)
CALCIUM: 9.1 mg/dL (ref 8.9–10.3)
CO2: 31 mmol/L (ref 22–32)
Chloride: 99 mmol/L — ABNORMAL LOW (ref 101–111)
Creatinine, Ser: 1 mg/dL (ref 0.61–1.24)
GFR calc Af Amer: >60 mL/min (ref >60)
GLUCOSE: 97 mg/dL (ref 65–99)
Potassium: 4 mmol/L (ref 3.5–5.1)
Sodium: 137 mmol/L (ref 135–145)

## 2016-07-04 LAB — CBC
HCT: 53.5 % — ABNORMAL HIGH (ref 39.0–52.0)
HEMOGLOBIN: 18.2 g/dL — AB (ref 13.0–17.0)
MCH: 30.7 pg (ref 26.0–34.0)
MCHC: 34 g/dL (ref 30.0–36.0)
MCV: 90.2 fL (ref 78.0–100.0)
Platelets: 245 10*3/uL (ref 150–400)
RBC: 5.93 MIL/uL — ABNORMAL HIGH (ref 4.22–5.81)
RDW: 13.3 % (ref 11.5–15.5)
WBC: 7.4 10*3/uL (ref 4.0–10.5)

## 2016-07-04 LAB — SURGICAL PCR SCREEN
MRSA, PCR: NEGATIVE
STAPHYLOCOCCUS AUREUS: NEGATIVE

## 2016-07-04 LAB — ABO/RH: ABO/RH(D): A POS

## 2016-07-04 NOTE — Patient Instructions (Addendum)
Victor PacerMartin [name] [name]  07/04/2016   Your procedure is scheduled on: Wednesday July 11, 2016  [facility]  [name]  [facility] to 3rd floor to  Short Stay Center at 8:00 AM.  Call this number if you have problems the morning of surgery [phone]   Remember: ONLY 1 PERSON MAY GO WITH YOU TO SHORT STAY TO GET  READY MORNING OF YOUR SURGERY.  Do not eat food or drink liquids :After Midnight.     Take these medicines the morning of surgery with A SIP OF WATER: Bupropion (Wellbutrin); Clonazepam (Klonopin) if needed; Duloxetine (Cymbalta); Oxycodone-Acetaminophen if needed                               You may not have any metal on your body including hair pins and              piercings  Do not wear jewelry, lotions, powders or colognes, deodorant                        Men may shave face and neck.   Do not bring valuables to the hospital. [name] IS NOT             RESPONSIBLE   FOR VALUABLES.  Contacts, dentures or bridgework may not be worn into surgery.  Leave suitcase in the car. After surgery it may be brought to your room.      Special Instructions: NO SMOKING 24 HOURS PRIOR TO SURGICAL PROCEDURE DATE              Please read over the following fact sheets you were given:MRSA INFORMATION SHEET; INCENTIVE SPIROMETER _____________________________________________________________________             [facility] Pre[facility] Before surgery, you can play an important role.  Because skin is not sterile, your skin needs to be as free of germs as possible.  You can reduce the number of germs on your skin by washing with CHG (chlorahexidine gluconate) soap before surgery.  CHG is an antiseptic cleaner which kills germs and bonds with the skin to continue killing germs even after washing. Please DO NOT use if you have an allergy to CHG or antibacterial soaps.  If your skin becomes reddened/irritated stop using the CHG and  inform your nurse when you arrive at Short Stay. Do not shave (including legs and underarms) for at least 48 hours prior to the first CHG shower.  You may shave your face/neck. Please follow these instructions carefully:  1.  Shower with CHG Soap the night before surgery and the  morning of Surgery.  2.  If you choose to wash your hair, wash your hair first as usual with your  normal  shampoo.  3.  After you shampoo, rinse your hair and body thoroughly to remove the  shampoo.                           4.  Use CHG as you would any other liquid soap.  You can apply chg directly  to the skin and wash                       Gently with a scrungie  or clean washcloth.  5.  Apply the CHG Soap to your body ONLY FROM THE NECK DOWN.   Do not use on face/ open                           Wound or open sores. Avoid contact with eyes, ears mouth and genitals (private parts).                       Wash face,  Genitals (private parts) with your normal soap.             6.  Wash thoroughly, paying special attention to the area where your surgery  will be performed.  7.  Thoroughly rinse your body with warm water from the neck down.  8.  DO NOT shower/wash with your normal soap after using and rinsing off  the CHG Soap.                9.  Pat yourself dry with a clean towel.            10.  Wear clean pajamas.            11.  Place clean sheets on your bed the night of your first shower and do not  sleep with pets. Day of Surgery : Do not apply any lotions/deodorants the morning of surgery.  Please wear clean clothes to the hospital/surgery center.  FAILURE TO FOLLOW THESE INSTRUCTIONS MAY RESULT IN THE CANCELLATION OF YOUR SURGERY PATIENT SIGNATURE_________________________________  NURSE SIGNATURE__________________________________  ________________________________________________________________________   [name]  An incentive spirometer is a tool that can help keep your lungs clear and  active. This tool measures how well you are filling your lungs with each breath. Taking long deep breaths may help reverse or decrease the chance of developing breathing (pulmonary) problems (especially infection) following:  A long period of time when you are unable to move or be active. BEFORE THE PROCEDURE   If the spirometer includes an indicator to show your best effort, your nurse or respiratory therapist will set it to a desired goal.  If possible, sit up straight or lean slightly forward. Try not to slouch.  Hold the incentive spirometer in an upright position. INSTRUCTIONS FOR USE  1. Sit on the edge of your bed if possible, or sit up as far as you can in bed or on a chair. 2. Hold the incentive spirometer in an upright position. 3. Breathe out normally. 4. Place the mouthpiece in your mouth and seal your lips tightly around it. 5. Breathe in slowly and as deeply as possible, raising the piston or the ball toward the top of the column. 6. Hold your breath for 3-5 seconds or for as long as possible. Allow the piston or ball to fall to the bottom of the column. 7. Remove the mouthpiece from your mouth and breathe out normally. 8. Rest for a few seconds and repeat Steps 1 through 7 at least 10 times every 1-2 hours when you are awake. Take your time and take a few normal breaths between deep breaths. 9. The spirometer may include an indicator to show your best effort. Use the indicator as a goal to work toward during each repetition. 10. After each set of 10 deep breaths, practice coughing to be sure your lungs are clear. If you have an incision (the cut made at the time of surgery), support your incision when  coughing by placing a pillow or rolled up towels firmly against it. Once you are able to get out of bed, walk around indoors and cough well. You may stop using the incentive spirometer when instructed by your caregiver.  RISKS AND COMPLICATIONS  Take your time so you do not get  dizzy or light-headed.  If you are in pain, you may need to take or ask for pain medication before doing incentive spirometry. It is harder to take a deep breath if you are having pain. AFTER USE  Rest and breathe slowly and easily.  It can be helpful to keep track of a log of your progress. Your caregiver can provide you with a simple table to help with this. If you are using the spirometer at home, follow these instructions: SEEK MEDICAL CARE IF:   You are having difficultly using the spirometer.  You have trouble using the spirometer as often as instructed.  Your pain medication is not giving enough relief while using the spirometer.  You develop fever of 100.5 F (38.1 C) or higher. SEEK IMMEDIATE MEDICAL CARE IF:   You cough up bloody sputum that had not been present before.  You develop fever of 102 F (38.9 C) or greater.  You develop worsening pain at or near the incision site. MAKE SURE YOU:   Understand these instructions.  Will watch your condition.  Will get help right away if you are not doing well or get worse. Document Released: 03/18/2007 Document Revised: 01/28/2012 Document Reviewed: 05/19/2007 [facility] Patient Information 2014 [employer] [place] [place].   ________________________________________________________________________

## 2016-07-11 ENCOUNTER — Ambulatory Visit (HOSPITAL_COMMUNITY): Payer: Worker's Compensation

## 2016-07-11 ENCOUNTER — Ambulatory Visit (HOSPITAL_COMMUNITY): Payer: Worker's Compensation | Admitting: Anesthesiology

## 2016-07-11 ENCOUNTER — Encounter (HOSPITAL_COMMUNITY): Payer: Self-pay | Admitting: *Deleted

## 2016-07-11 ENCOUNTER — Ambulatory Visit (HOSPITAL_COMMUNITY)
Admission: RE | Admit: 2016-07-11 | Discharge: 2016-07-12 | Disposition: A | Discharge status: Home / Self Care | Payer: Worker's Compensation | Source: Ambulatory Visit | Attending: Specialist | Admitting: Specialist

## 2016-07-11 ENCOUNTER — Encounter (HOSPITAL_COMMUNITY)
Admission: RE | Disposition: A | Discharge status: Home / Self Care | Payer: Self-pay | Source: Ambulatory Visit | Attending: Specialist

## 2016-07-11 DIAGNOSIS — Z79899 Other long term (current) drug therapy: Secondary | ICD-10-CM | POA: Diagnosis not present

## 2016-07-11 DIAGNOSIS — I10 Essential (primary) hypertension: Secondary | ICD-10-CM | POA: Diagnosis not present

## 2016-07-11 DIAGNOSIS — F17210 Nicotine dependence, cigarettes, uncomplicated: Secondary | ICD-10-CM | POA: Diagnosis not present

## 2016-07-11 DIAGNOSIS — M48061 Spinal stenosis, lumbar region without neurogenic claudication: Secondary | ICD-10-CM

## 2016-07-11 DIAGNOSIS — M5116 Intervertebral disc disorders with radiculopathy, lumbar region: Secondary | ICD-10-CM | POA: Insufficient documentation

## 2016-07-11 DIAGNOSIS — Z419 Encounter for procedure for purposes other than remedying health state, unspecified: Secondary | ICD-10-CM

## 2016-07-11 HISTORY — DX: Spinal stenosis, lumbar region without neurogenic claudication: M48.061

## 2016-07-11 HISTORY — PX: LUMBAR LAMINECTOMY/DECOMPRESSION MICRODISCECTOMY: SHX5026

## 2016-07-11 LAB — TYPE AND SCREEN
ABO/RH(D): A POS
Antibody Screen: NEGATIVE

## 2016-07-11 SURGERY — LUMBAR LAMINECTOMY/DECOMPRESSION MICRODISCECTOMY
Anesthesia: General | Site: Back | Laterality: Right

## 2016-07-11 MED ORDER — MIDAZOLAM HCL 2 MG/2ML IJ SOLN
INTRAMUSCULAR | Status: AC
  Filled 2016-07-11: qty 2

## 2016-07-11 MED ORDER — LOSARTAN POTASSIUM 50 MG PO TABS
100.0000 mg | ORAL_TABLET | Freq: Every day | ORAL | Status: DC
  Administered 2016-07-11 – 2016-07-12 (×2): 100 mg via ORAL
  Filled 2016-07-11 (×2): qty 2

## 2016-07-11 MED ORDER — ACETAMINOPHEN 325 MG PO TABS: 650.0000 mg | ORAL_TABLET | ORAL | Status: DC | PRN

## 2016-07-11 MED ORDER — SODIUM CHLORIDE 0.9 % IR SOLN
Status: AC
  Filled 2016-07-11: qty 1

## 2016-07-11 MED ORDER — PHENOL 1.4 % MT LIQD: 1.0000 | OROMUCOSAL | Status: DC | PRN

## 2016-07-11 MED ORDER — ROCURONIUM BROMIDE 100 MG/10ML IV SOLN
INTRAVENOUS | Status: AC
  Filled 2016-07-11: qty 1

## 2016-07-11 MED ORDER — KCL IN DEXTROSE-NACL 20-5-0.45 MEQ/L-%-% IV SOLN
INTRAVENOUS | Status: AC
  Administered 2016-07-11: 16:00:00 via INTRAVENOUS
  Filled 2016-07-11: qty 1000

## 2016-07-11 MED ORDER — FENTANYL CITRATE (PF) 100 MCG/2ML IJ SOLN
INTRAMUSCULAR | Status: DC | PRN
  Administered 2016-07-11 (×2): 50 ug via INTRAVENOUS

## 2016-07-11 MED ORDER — POLYETHYLENE GLYCOL 3350 17 G PO PACK: 17.0000 g | PACK | Freq: Every day | ORAL | 0 refills | Status: DC

## 2016-07-11 MED ORDER — DOCUSATE SODIUM 100 MG PO CAPS: 100.0000 mg | ORAL_CAPSULE | Freq: Two times a day (BID) | ORAL | 1 refills | Status: DC | PRN

## 2016-07-11 MED ORDER — CLONAZEPAM 1 MG PO TABS
2.0000 mg | ORAL_TABLET | Freq: Two times a day (BID) | ORAL | Status: DC | PRN
  Administered 2016-07-12: 2 mg via ORAL
  Filled 2016-07-11: qty 2

## 2016-07-11 MED ORDER — PROPOFOL 10 MG/ML IV BOLUS
INTRAVENOUS | Status: AC
  Filled 2016-07-11: qty 20

## 2016-07-11 MED ORDER — ACETAMINOPHEN 650 MG RE SUPP: 650.0000 mg | RECTAL | Status: DC | PRN

## 2016-07-11 MED ORDER — MAGNESIUM CITRATE PO SOLN: 1.0000 | Freq: Once | ORAL | Status: DC | PRN

## 2016-07-11 MED ORDER — HYDROMORPHONE HCL 1 MG/ML IJ SOLN
0.2500 mg | INTRAMUSCULAR | Status: DC | PRN
  Administered 2016-07-11 (×4): 0.5 mg via INTRAVENOUS

## 2016-07-11 MED ORDER — OXYCODONE-ACETAMINOPHEN 5-325 MG PO TABS
1.0000 | ORAL_TABLET | ORAL | Status: DC | PRN
  Administered 2016-07-11 (×2): 1 via ORAL
  Administered 2016-07-12 (×4): 2 via ORAL
  Filled 2016-07-11: qty 2
  Filled 2016-07-11: qty 1
  Filled 2016-07-11: qty 2
  Filled 2016-07-11: qty 1
  Filled 2016-07-11 (×2): qty 2

## 2016-07-11 MED ORDER — BUPROPION HCL ER (XL) 150 MG PO TB24
150.0000 mg | ORAL_TABLET | Freq: Every day | ORAL | Status: DC
  Administered 2016-07-11 – 2016-07-12 (×2): 150 mg via ORAL
  Filled 2016-07-11 (×2): qty 1

## 2016-07-11 MED ORDER — POLYETHYLENE GLYCOL 3350 17 G PO PACK: 17.0000 g | PACK | Freq: Every day | ORAL | Status: DC | PRN

## 2016-07-11 MED ORDER — ROCURONIUM BROMIDE 100 MG/10ML IV SOLN
INTRAVENOUS | Status: DC | PRN
  Administered 2016-07-11: 10 mg via INTRAVENOUS
  Administered 2016-07-11: 40 mg via INTRAVENOUS

## 2016-07-11 MED ORDER — RISAQUAD PO CAPS
1.0000 | ORAL_CAPSULE | Freq: Every day | ORAL | Status: DC
  Administered 2016-07-12: 1 via ORAL
  Filled 2016-07-11: qty 1

## 2016-07-11 MED ORDER — PHENYLEPHRINE HCL 10 MG/ML IJ SOLN
INTRAMUSCULAR | Status: AC
  Filled 2016-07-11: qty 1

## 2016-07-11 MED ORDER — BUPIVACAINE-EPINEPHRINE 0.5% -1:200000 IJ SOLN
INTRAMUSCULAR | Status: DC | PRN
  Administered 2016-07-11: 10 mL

## 2016-07-11 MED ORDER — HYDROMORPHONE HCL 1 MG/ML IJ SOLN
INTRAMUSCULAR | Status: AC
  Filled 2016-07-11: qty 1

## 2016-07-11 MED ORDER — ONDANSETRON HCL 4 MG/2ML IJ SOLN
4.0000 mg | INTRAMUSCULAR | Status: DC | PRN
Start: 2016-07-11 — End: 2016-07-12

## 2016-07-11 MED ORDER — SODIUM CHLORIDE 0.9 % IV SOLN
INTRAVENOUS | Status: DC | PRN
  Administered 2016-07-11: 30 ug/min via INTRAVENOUS

## 2016-07-11 MED ORDER — FENTANYL CITRATE (PF) 100 MCG/2ML IJ SOLN
INTRAMUSCULAR | Status: AC
  Filled 2016-07-11: qty 2

## 2016-07-11 MED ORDER — SUGAMMADEX SODIUM 200 MG/2ML IV SOLN
INTRAVENOUS | Status: DC | PRN
  Administered 2016-07-11: 200 mg via INTRAVENOUS

## 2016-07-11 MED ORDER — PROPOFOL 10 MG/ML IV BOLUS
INTRAVENOUS | Status: DC | PRN
  Administered 2016-07-11: 200 mg via INTRAVENOUS

## 2016-07-11 MED ORDER — FLUTICASONE PROPIONATE 50 MCG/ACT NA SUSP
2.0000 | Freq: Every day | NASAL | Status: DC
  Administered 2016-07-12: 2 via NASAL
  Filled 2016-07-11: qty 16

## 2016-07-11 MED ORDER — CEFAZOLIN SODIUM-DEXTROSE 2-4 GM/100ML-% IV SOLN
2.0000 g | Freq: Three times a day (TID) | INTRAVENOUS | Status: AC
  Administered 2016-07-11 – 2016-07-12 (×2): 2 g via INTRAVENOUS
  Filled 2016-07-11 (×2): qty 100

## 2016-07-11 MED ORDER — SUCCINYLCHOLINE CHLORIDE 20 MG/ML IJ SOLN
INTRAMUSCULAR | Status: DC | PRN
  Administered 2016-07-11: 100 mg via INTRAVENOUS

## 2016-07-11 MED ORDER — PHENYLEPHRINE HCL 10 MG/ML IJ SOLN
INTRAMUSCULAR | Status: DC | PRN
  Administered 2016-07-11 (×3): 80 ug via INTRAVENOUS

## 2016-07-11 MED ORDER — MENTHOL 3 MG MT LOZG: 1.0000 | LOZENGE | OROMUCOSAL | Status: DC | PRN

## 2016-07-11 MED ORDER — LORATADINE 10 MG PO TABS
10.0000 mg | ORAL_TABLET | Freq: Every day | ORAL | Status: DC
  Administered 2016-07-12: 10 mg via ORAL
  Filled 2016-07-11: qty 1

## 2016-07-11 MED ORDER — DEXAMETHASONE SODIUM PHOSPHATE 10 MG/ML IJ SOLN
INTRAMUSCULAR | Status: DC | PRN
  Administered 2016-07-11: 10 mg via INTRAVENOUS

## 2016-07-11 MED ORDER — NICOTINE 21 MG/24HR TD PT24
21.0000 mg | MEDICATED_PATCH | Freq: Every day | TRANSDERMAL | Status: DC
  Administered 2016-07-12: 21 mg via TRANSDERMAL
  Filled 2016-07-11 (×2): qty 1

## 2016-07-11 MED ORDER — BISACODYL 5 MG PO TBEC: 5.0000 mg | DELAYED_RELEASE_TABLET | Freq: Every day | ORAL | Status: DC | PRN

## 2016-07-11 MED ORDER — HYDROMORPHONE HCL 1 MG/ML IJ SOLN
0.5000 mg | INTRAMUSCULAR | Status: DC | PRN
  Administered 2016-07-11 (×2): 0.5 mg via INTRAVENOUS
  Administered 2016-07-12: 1 mg via INTRAVENOUS
  Filled 2016-07-11 (×3): qty 1

## 2016-07-11 MED ORDER — ONDANSETRON HCL 4 MG/2ML IJ SOLN
INTRAMUSCULAR | Status: DC | PRN
  Administered 2016-07-11: 4 mg via INTRAVENOUS

## 2016-07-11 MED ORDER — ONDANSETRON HCL 4 MG/2ML IJ SOLN
INTRAMUSCULAR | Status: AC
  Filled 2016-07-11: qty 2

## 2016-07-11 MED ORDER — DOCUSATE SODIUM 100 MG PO CAPS
100.0000 mg | ORAL_CAPSULE | Freq: Two times a day (BID) | ORAL | Status: DC
  Administered 2016-07-11 – 2016-07-12 (×2): 100 mg via ORAL
  Filled 2016-07-11 (×2): qty 1

## 2016-07-11 MED ORDER — OXYCODONE HCL 5 MG PO TABS: 5.0000 mg | ORAL_TABLET | ORAL | 0 refills | Status: DC | PRN

## 2016-07-11 MED ORDER — SUGAMMADEX SODIUM 200 MG/2ML IV SOLN
INTRAVENOUS | Status: AC
  Filled 2016-07-11: qty 2

## 2016-07-11 MED ORDER — LIDOCAINE HCL (CARDIAC) 20 MG/ML IV SOLN
INTRAVENOUS | Status: AC
  Filled 2016-07-11: qty 5

## 2016-07-11 MED ORDER — DULOXETINE HCL 30 MG PO CPEP
60.0000 mg | ORAL_CAPSULE | Freq: Every day | ORAL | Status: DC
  Administered 2016-07-12: 60 mg via ORAL
  Filled 2016-07-11: qty 2

## 2016-07-11 MED ORDER — MIDAZOLAM HCL 5 MG/5ML IJ SOLN
INTRAMUSCULAR | Status: DC | PRN
  Administered 2016-07-11: 2 mg via INTRAVENOUS

## 2016-07-11 MED ORDER — CEFAZOLIN SODIUM-DEXTROSE 2-4 GM/100ML-% IV SOLN
2.0000 g | INTRAVENOUS | Status: AC
  Administered 2016-07-11: 2 g via INTRAVENOUS

## 2016-07-11 MED ORDER — HYDROCODONE-ACETAMINOPHEN 5-325 MG PO TABS
1.0000 | ORAL_TABLET | ORAL | Status: DC | PRN
  Administered 2016-07-11: 1 via ORAL
  Filled 2016-07-11: qty 1

## 2016-07-11 MED ORDER — DEXAMETHASONE SODIUM PHOSPHATE 10 MG/ML IJ SOLN
INTRAMUSCULAR | Status: AC
Start: 2016-07-11 — End: 2016-07-11
  Filled 2016-07-11: qty 1

## 2016-07-11 MED ORDER — CEFAZOLIN SODIUM-DEXTROSE 2-4 GM/100ML-% IV SOLN
INTRAVENOUS | Status: AC
  Filled 2016-07-11: qty 100

## 2016-07-11 MED ORDER — LACTATED RINGERS IV SOLN
INTRAVENOUS | Status: DC
  Administered 2016-07-11: 1000 mL via INTRAVENOUS
  Administered 2016-07-11: 11:00:00 via INTRAVENOUS

## 2016-07-11 MED ORDER — SODIUM CHLORIDE 0.9 % IR SOLN
Status: DC | PRN
  Administered 2016-07-11: 500 mL

## 2016-07-11 MED ORDER — LIDOCAINE HCL (CARDIAC) 20 MG/ML IV SOLN
INTRAVENOUS | Status: DC | PRN
  Administered 2016-07-11: 100 mg via INTRAVENOUS

## 2016-07-11 MED ORDER — BUPIVACAINE-EPINEPHRINE (PF) 0.5% -1:200000 IJ SOLN
INTRAMUSCULAR | Status: AC
  Filled 2016-07-11: qty 30

## 2016-07-11 MED ORDER — PHENYLEPHRINE 40 MCG/ML (10ML) SYRINGE FOR IV PUSH (FOR BLOOD PRESSURE SUPPORT)
PREFILLED_SYRINGE | INTRAVENOUS | Status: AC
Start: 2016-07-11 — End: 2016-07-11
  Filled 2016-07-11: qty 10

## 2016-07-11 MED ORDER — ALUM & MAG HYDROXIDE-SIMETH 200-200-20 MG/5ML PO SUSP: 30.0000 mL | Freq: Four times a day (QID) | ORAL | Status: DC | PRN

## 2016-07-11 SURGICAL SUPPLY — 45 items
BAG ZIPLOCK 12X15 (MISCELLANEOUS) IMPLANT
CLOSURE WOUND 1/2 X4 (GAUZE/BANDAGES/DRESSINGS) ×1
CLOTH 2% CHLOROHEXIDINE 3PK (PERSONAL CARE ITEMS) ×3 IMPLANT
DRAPE MICROSCOPE LEICA (MISCELLANEOUS) ×3 IMPLANT
DRAPE SHEET LG 3/4 BI-LAMINATE (DRAPES) IMPLANT
DRAPE SURG 17X11 SM STRL (DRAPES) ×3 IMPLANT
DRAPE UTILITY XL STRL (DRAPES) ×3 IMPLANT
DRSG AQUACEL AG ADV 3.5X 4 (GAUZE/BANDAGES/DRESSINGS) ×3 IMPLANT
DRSG AQUACEL AG ADV 3.5X 6 (GAUZE/BANDAGES/DRESSINGS) IMPLANT
DURAPREP 26ML APPLICATOR (WOUND CARE) ×3 IMPLANT
DURASEAL SPINE SEALANT 3ML (MISCELLANEOUS) IMPLANT
ELECT BLADE TIP CTD 4 INCH (ELECTRODE) ×3 IMPLANT
ELECT REM PT RETURN 9FT ADLT (ELECTROSURGICAL) ×3
ELECTRODE REM PT RTRN 9FT ADLT (ELECTROSURGICAL) ×1 IMPLANT
GLOVE BIOGEL PI IND STRL 7.0 (GLOVE) ×1 IMPLANT
GLOVE BIOGEL PI INDICATOR 7.0 (GLOVE) ×2
GLOVE SURG SS PI 7.0 STRL IVOR (GLOVE) ×3 IMPLANT
GLOVE SURG SS PI 7.5 STRL IVOR (GLOVE) ×3 IMPLANT
GLOVE SURG SS PI 8.0 STRL IVOR (GLOVE) ×6 IMPLANT
GOWN STRL REUS W/TWL XL LVL3 (GOWN DISPOSABLE) ×6 IMPLANT
HEMOSTAT SPONGE AVITENE ULTRA (HEMOSTASIS) ×3 IMPLANT
IV CATH 14GX2 1/4 (CATHETERS) ×3 IMPLANT
KIT BASIN OR (CUSTOM PROCEDURE TRAY) ×3 IMPLANT
KIT POSITIONING SURG ANDREWS (MISCELLANEOUS) ×3 IMPLANT
MANIFOLD NEPTUNE II (INSTRUMENTS) ×3 IMPLANT
NEEDLE SPNL 18GX3.5 QUINCKE PK (NEEDLE) ×6 IMPLANT
PACK LAMINECTOMY ORTHO (CUSTOM PROCEDURE TRAY) ×3 IMPLANT
PATTIES SURGICAL .5 X.5 (GAUZE/BANDAGES/DRESSINGS) IMPLANT
PATTIES SURGICAL .75X.75 (GAUZE/BANDAGES/DRESSINGS) ×3 IMPLANT
RUBBERBAND STERILE (MISCELLANEOUS) ×6 IMPLANT
SPONGE SURGIFOAM ABS GEL 100 (HEMOSTASIS) ×3 IMPLANT
STAPLER VISISTAT (STAPLE) IMPLANT
STRIP CLOSURE SKIN 1/2X4 (GAUZE/BANDAGES/DRESSINGS) ×2 IMPLANT
SUT NURALON 4 0 TR CR/8 (SUTURE) IMPLANT
SUT PROLENE 3 0 PS 2 (SUTURE) ×3 IMPLANT
SUT VIC AB 1 CT1 27 (SUTURE) ×2
SUT VIC AB 1 CT1 27XBRD ANTBC (SUTURE) ×1 IMPLANT
SUT VIC AB 1-0 CT2 27 (SUTURE) ×3 IMPLANT
SUT VIC AB 2-0 CT1 27 (SUTURE) ×2
SUT VIC AB 2-0 CT1 TAPERPNT 27 (SUTURE) ×1 IMPLANT
SUT VIC AB 2-0 CT2 27 (SUTURE) ×3 IMPLANT
SYR 3ML LL SCALE MARK (SYRINGE) ×3 IMPLANT
TOWEL OR 17X26 10 PK STRL BLUE (TOWEL DISPOSABLE) ×3 IMPLANT
TRAY FOLEY CATH 16FRSI W/METER (SET/KITS/TRAYS/PACK) ×3 IMPLANT
YANKAUER SUCT BULB TIP NO VENT (SUCTIONS) ×3 IMPLANT

## 2016-07-11 NOTE — Anesthesia Preprocedure Evaluation (Addendum)
Anesthesia Evaluation  Patient identified by MRN, date of birth, ID band Patient awake    Reviewed: Allergy & Precautions, H&P , NPO status , Patient's Chart, lab work & pertinent test results  Airway Mallampati: II  TM Distance: >3 FB Neck ROM: Full    Dental no notable dental hx. (+) Teeth Intact, Dental Advisory Given   Pulmonary neg pulmonary ROS, former smoker,    Pulmonary exam normal breath sounds clear to auscultation       Cardiovascular hypertension, Pt. on medications  Rhythm:Regular Rate:Normal     Neuro/Psych Depression negative neurological ROS  negative psych ROS   GI/Hepatic negative GI ROS, Neg liver ROS,   Endo/Other  negative endocrine ROS  Renal/GU negative Renal ROS  negative genitourinary   Musculoskeletal   Abdominal   Peds  Hematology negative hematology ROS (+)   Anesthesia Other Findings   Reproductive/Obstetrics negative OB ROS                            Anesthesia Physical Anesthesia Plan  ASA: II  Anesthesia Plan: General   Post-op Pain Management:    Induction: Intravenous  Airway Management Planned: Oral ETT  Additional Equipment:   Intra-op Plan:   Post-operative Plan: Extubation in OR  Informed Consent: I have reviewed the patients History and Physical, chart, labs and discussed the procedure including the risks, benefits and alternatives for the proposed anesthesia with the patient or authorized representative who has indicated his/her understanding and acceptance.   Dental advisory given  Plan Discussed with: CRNA  Anesthesia Plan Comments:         Anesthesia Quick Evaluation

## 2016-07-11 NOTE — Discharge Instructions (Signed)

## 2016-07-11 NOTE — Transfer of Care (Signed)
Immediate Anesthesia Transfer of Care Note  Patient: Victor PacerMartin MORALES VASQUEZ  Procedure(s) Performed: Procedure(s): REVISON MICRO LUMBAR DECOMPRESSION L4 - L5 ON THE RIGHT (Right)  Patient Location: PACU  Anesthesia Type:General  Level of Consciousness: sedated  Airway & Oxygen Therapy: Patient Spontanous Breathing and Patient connected to face mask oxygen  Post-op Assessment: Report given to RN and Post -op Vital signs reviewed and stable  Post vital signs: Reviewed and stable  Last Vitals:  Vitals:   07/11/16 0801  BP: (!) 160/105  Pulse: 93  Resp: 18  Temp: 36.3 C    Last Pain:  Vitals:   07/11/16 0925  TempSrc:   PainSc: 4       Patients Stated Pain Goal: 4 (07/11/16 0925)  Complications: No apparent anesthesia complications

## 2016-07-11 NOTE — Brief Op Note (Signed)
07/11/2016  10:45 AM  PATIENT:  Victor [name]  50 y.o. male  PRE-OPERATIVE DIAGNOSIS:  Stenosis L4 - L5  HNP  POST-OPERATIVE DIAGNOSIS:  Stenosis L4 - L5  HNP  PROCEDURE:  Procedure(s): REVISON MICRO LUMBAR DECOMPRESSION L4 - L5 ON THE RIGHT (Right)  SURGEON:  Surgeon(s) and Role:    * Jene EveryJeffrey Shafin Pollio, MD - Primary  PHYSICIAN ASSISTANT:   ASSISTANTS: Bissell   ANESTHESIA:   general  EBL:  Total I/O In: 1000 [I.V.:1000] Out: 250 [Urine:200; Blood:50]  BLOOD ADMINISTERED:none  DRAINS: none   LOCAL MEDICATIONS USED:  MARCAINE     SPECIMEN:  Source of Specimen:  L45  DISPOSITION OF SPECIMEN:  PATHOLOGY  COUNTS:  YES  TOURNIQUET:  * No tourniquets in log *  DICTATION: .Other Dictation: Dictation Number [id number]  PLAN OF CARE: Admit for overnight observation  PATIENT DISPOSITION:  PACU - hemodynamically stable.   Delay start of Pharmacological VTE agent (>24hrs) due to surgical blood loss or risk of bleeding: yes

## 2016-07-11 NOTE — H&P (View-Only) (Signed)
Victor [name] is an 50 y.o. male.   Chief Complaint: back and R leg pain HPI: The patient is a 50 year old male who presents today for follow up of their back. The patient is being followed for their low back symptoms. They are now 9 week(s) out from recent injury. The patient states that he fell. The patient is 13 years and 8 months out from original injury (DOI 09/15/2002). Symptoms reported today include: pain. Current treatment includes: activity modification and NSAIDs. The following medication has been used for pain control: Ultram and Mobic. The patient presents today following CT/Myelogram. Note for "Follow-up back": The patient currently has light duty restrictions of no lifting over 10 lbs, no bendin, stooping, squatting greater than 2 times per hour and no proloned standing or sitting greater than 60 minutes. 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  Victor [name] follows up with his case manager, Victor PickleAmanda [name]. Mr. Victor [name] reports he has predominant leg pain that radiates down into the top of his foot, worse with any activity. Again, he is nine weeks status post a re-injury where he fell. He is having less back pain than he has buttock and leg pain. He was treated with a history of a lumbar decompression. He has intermittent mechanical back pain.  Past Medical History:  Diagnosis Date  . Back pain   . Shoulder pain     Past Surgical History:  Procedure Laterality Date  . BACK SURGERY    . ROTATOR CUFF REPAIR      No family history on file. Social History:  reports that he has been smoking Cigarettes.  He has been smoking about 0.20 packs per day. He has never used smokeless tobacco. He reports that he uses drugs. He reports that he does not drink alcohol.  Allergies: No Known Allergies   (Not in a hospital admission)  No results found for this or any previous visit (from the past 48 hour(s)). No results found.  Review of Systems  Constitutional: Negative.   HENT: Negative.   Eyes:  Negative.   Respiratory: Negative.   Cardiovascular: Negative.   Gastrointestinal: Negative.   Genitourinary: Negative.   Musculoskeletal: Positive for back pain.  Skin: Negative.   Neurological: Positive for sensory change and focal weakness.  Psychiatric/Behavioral: Negative.     There were no vitals taken for this visit. Physical Exam  Constitutional: He is oriented to person, place, and time. He appears well-developed.  HENT:  Head: Normocephalic.  Eyes: Pupils are equal, round, and reactive to light.  Neck: Normal range of motion.  Cardiovascular: Normal rate.   Respiratory: Effort normal.  GI: Soft.  Musculoskeletal:  On exam, he is in moderate-to-severe distress. Mood and affect is appropriate. He walks with an antalgic gait. He is tender in the right proximal gluteus. His straight leg raise produces buttock, thigh and calf pain on the right, negative on the left. He has 4+/5 EHL on the right compared to the left. He reports altered sensation at the L5 dermatome.  Risks and benefits of the anterior lumbar fusion were discussed with the patient including bleeding, infection, damage to neurovascular structures, nonunion, hardware failure, need for repositioning, autograft, allograft, bone graft, DVT, PE, anesthetic complications, extended time to union as well as the inability to perform the procedure. We utilized illustrated drawings to discuss the implant.  He has an area of subcutaneous or superficial tenderness in the right proximal gluteus near the cluneal nerves.  Neurological: He is alert and oriented to person, place, and  time.  Skin: Skin is warm and dry.    His x-rays demonstrates moderately severe disk degeneration at 4-5, no instability in flexion and extension. Hips are unremarkable.  Myelogram demonstrates a small, recurrent disk herniation at 4-5 cephalad with effacement of the 5 root.   Assessment/Plan 1. L5 radiculopathy, refractory, nine weeks status post  re-injury at work with myotomal weakness dermatome with dysesthesias despite rest, activity modification, epidural and physical therapy. 2. History of a lumbar decompression with moderate-to-severe disk degeneration at 4-5 without instability. He reports he does not have disabling back pain.  We had an extensive discussion concerning current pathology, relevant anatomy and treatment options. At this point in time, he has got the option of living with his symptoms and pain management and activity modification which he reports he is unable to do so. We; therefore, discussed surgical options: 1. Revision lumbar decompression at 4-5 and discectomy versus revision lumbar decompression, discectomy and fusion. Given his minimal back pain and no instability, he has an open foramen. We discussed proceeding with a revision lumbar decompression only, microscopic.  I had an extensive discussion of the risks and benefits of the lumbar decompression with the patient including bleeding, infection, damage to neurovascular structures, epidural fibrosis, CSF leak requiring repair. We also discussed increase in pain, adjacent segment disease, recurrent disc herniation, need for future surgery including repeat decompression and/or fusion. We also discussed risks of postoperative hematoma, paralysis, anesthetic complications including DVT, PE, death, cardiopulmonary dysfunction. In addition, the perioperative and postoperative courses were discussed in detail including the rehabilitative time and return to functional activity and work. I provided the patient with an illustrated handout and utilized the appropriate surgical models.  We discussed overnight in the hospital, two weeks for suture removal and physical therapy for six weeks followed by potential work conditioning from six to 10 weeks from the six-week point to the 10-week point. He is a welder, light duty at six weeks.  In terms of challenges of returning to work, we  discussed a table that could be fabricated such that he can lean on to take the pressure off his lower back. 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 We discussed again the risks of recurrent disk herniation, need for fusion in the future. Increased risks of epidural and CSF leak with revision surgery. No history of MRSA or DVT. He has asked for different pain medicines and I gave him a prescription for Robaxin to be taken as directed as well as Norco. Terocin patch maybe beneficial as well up to every 12 hours to the area of the cluneal nerves, where he has some hypersensitivity. He has a lot of derm-type patch. I will discuss this in front of the patient with Victor [name], case manager.  In the interim, continue light duty. No lifting over 10 pounds, prolonged standing and sitting and repetitive bending. Any further questions. he is to contact me prior to surgical scheduling.  Plan revision microlumbar decompression L4-5 right  Omega Slager M., PA-C for Dr. Beane 06/21/2016, 1:33 PM   

## 2016-07-11 NOTE — Op Note (Signed)
NAME:  Victor [name], Victor [name]      ACCOUNT NO.:  [id number]  MEDICAL RECORD NO.:  [id number]  LOCATION:  [facility]                         [facility]  PHYSICIAN:  Jene EveryJeffrey Sindia [name], M.D.    DATE OF BIRTH:  Mar 23, 1966  DATE OF PROCEDURE:  07/11/2016 DATE OF DISCHARGE:                              OPERATIVE REPORT   PREOPERATIVE DIAGNOSES:  Recurrent disk herniation and spinal stenosis, L4-5, right.  POSTOPERATIVE DIAGNOSES:  Recurrent disk herniation and spinal stenosis, L4-5, right.  PROCEDURES PERFORMED: 1. Revision of microlumbar decompression, L4-5, right. 2. Foraminotomies of L4 and L5, right. 3. Microdiskectomy, 4-5, right.  ANESTHESIA:  General.  ASSISTANT:  Victor PocheJacqueline Bissell, PA.  HISTORY:  A 50, recurrent disk, history of lumbar decompression, progressive disk degeneration with L5 radiculopathy.  MRI indicating disk herniation, indicated for revision, decompression, due to the disk compression and stenosis.  Risks and benefits were discussed including bleeding, infection, damage to the neurovascular structures, no change in symptoms, worsening symptoms, need for fusion in the future, etc.  He did have disk degeneration with minimal back pain, mainly leg pain.  TECHNIQUE:  With the patient in supine position, after the induction of adequate general anesthesia, 2 g of Kefzol, was placed prone on the [place] frame.  All bony prominences were well padded.  Lumbar region was prepped and draped in usual sterile fashion.  Previous surgical incision was utilized.  Scar was excised.  Subcutaneous tissue was dissected, electrocautery was utilized to achieve hemostasis. Dorsolumbar fascia was identified, divided in line with skin incision. Paraspinous muscle elevated from lamina of 4-5.  McCullough retractor was placed, operating microscope was draped and brought on the surgical field.  Skeletonized the previous hemilaminotomy with the curette and the previous interlaminar  window was identified.  Scar tissue was excised, epidural fibrosis was noted and mobilized.  I was able to identify the L5 foramen and safe zone at the inferior aspect of the facet at 4-5.  I then used a 2-mm Kerrison to remove a portion of the inferior process of L4, preserving the pars.  I identified the superior articulating process of 5.  Developed a plane between the epidural fibrosis and the facet.  I then used a 2-mm Kerrison to remove a portion of the superior articulating facet, which was extending into the 4 foramen compressing the 4 root and the 5 root in the lateral recess. Decompressed the lateral recess, the medial border of the pedicle, performed a foraminotomy of 4, preserving the pars.  This was mainly undercutting the superior articulating process of 5.  Focal HNP was noted.  The bipolar electrocautery was utilized to achieve hemostasis. Annulotomy was performed.  Disk material was removed from the disk space consistent with that seen on the MRI.  Disk degeneration was noted.  I performed a generous foraminotomy of 5 as well, mobilizing the 5 root. After that, the neural probe passed freely up the foramen of 4 and 5.  A 1-cm of excursion of the 5 root medial to pedicle without tension.  We irrigated the disk space with antibiotic irrigation and retrieved additional fragments with micropituitary, no CSF leakage or active bleeding.  Good restoration of thecal sac.  Neural probe passed freely above the pedicle of  4 and below the pedicle of 5 and out the foramen of 4 and 5.  No residual disk herniation.  McCullough retractor was removed, irrigated the wound.  No active bleeding.  Dorsolumbar fascia was closed with 1 Vicryl, subcu with 2-0 and skin with Prolene.  Sterile dressing applied, placed supine on the hospital bed, extubated without difficulty and transported to the recovery room in satisfactory condition.  The patient tolerated the procedure well.  No  complications.  Assistant, Victor [name], [place].  Minimal blood loss.     Jene EveryJeffrey Brad [name], M.D.     Victor PenJB/MEDQ  D:  07/11/2016  T:  07/11/2016  Job:  [id number]

## 2016-07-11 NOTE — Anesthesia Procedure Notes (Signed)
Procedure Name: Intubation Date/Time: 07/11/2016 9:38 AM Performed by: Doran ClayALDAY, Aerin Delany R Pre-anesthesia Checklist: Patient identified, Emergency Drugs available, Suction available, Patient being monitored and Timeout performed Patient Re-evaluated:Patient Re-evaluated prior to inductionOxygen Delivery Method: Circle system utilized Preoxygenation: Pre-oxygenation with 100% oxygen Intubation Type: IV induction Laryngoscope Size: Mac and 4 Grade View: Grade I Tube type: Oral Tube size: 7.5 mm Number of attempts: 1 Airway Equipment and Method: Stylet Placement Confirmation: ETT inserted through vocal cords under direct vision,  positive ETCO2 and breath sounds checked- equal and bilateral Secured at: 22 cm Tube secured with: Tape Dental Injury: Teeth and Oropharynx as per pre-operative assessment

## 2016-07-11 NOTE — Anesthesia Postprocedure Evaluation (Signed)
Anesthesia Post Note  Patient: Victor [name]  Procedure(s) Performed: Procedure(s) (LRB): REVISON MICRO LUMBAR DECOMPRESSION L4 - L5 ON THE RIGHT (Right)  Patient location during evaluation: PACU Anesthesia Type: General Level of consciousness: awake and alert Pain management: pain level controlled Vital Signs Assessment: post-procedure vital signs reviewed and stable Respiratory status: spontaneous breathing, nonlabored ventilation, respiratory function stable and patient connected to nasal cannula oxygen Cardiovascular status: blood pressure returned to baseline and stable Postop Assessment: no signs of nausea or vomiting Anesthetic complications: no    Last Vitals:  Vitals:   07/11/16 1145 07/11/16 1200  BP: 132/86 132/90  Pulse: 88 88  Resp: 16 16  Temp:  36.8 C    Last Pain:  Vitals:   07/11/16 1200  TempSrc:   PainSc: Asleep    LLE Motor Response: Purposeful movement (07/11/16 1200) LLE Sensation: Full sensation (07/11/16 1200) RLE Motor Response: Purposeful movement (07/11/16 1200) RLE Sensation: Full sensation (07/11/16 1200)      Sheletha Bow,W. EDMOND

## 2016-07-11 NOTE — Interval H&P Note (Signed)
History and Physical Interval Note:  07/11/2016 9:08 AM  Victor BuntingMartin MORALES VASQUEZ  has presented today for surgery, with the diagnosis of Stenosis L4 - L5  HNP  The various methods of treatment have been discussed with the patient and family. After consideration of risks, benefits and other options for treatment, the patient has consented to  Procedure(s): REVISON MICRO LUMBAR DECOMPRESSION L4 - L5 ON THE RIGHT (Right) as a surgical intervention .  The patient's history has been reviewed, patient examined, no change in status, stable for surgery.  I have reviewed the patient's chart and labs.  Questions were answered to the patient's satisfaction.     Myalynn Lingle C

## 2016-07-12 DIAGNOSIS — M5116 Intervertebral disc disorders with radiculopathy, lumbar region: Secondary | ICD-10-CM | POA: Diagnosis not present

## 2016-07-12 NOTE — Progress Notes (Signed)
Subjective: 1 Day Post-Op Procedure(s) (LRB): REVISON MICRO LUMBAR DECOMPRESSION L4 - L5 ON THE RIGHT (Right) Patient reports pain as moderate.  Reports back pain but notes improvement in leg pain. Seen by myself and Dr. Shelle IronBeane in AM rounds.  Objective: Vital signs in last 24 hours: Temp:  [97.7 F (36.5 C)-98.7 F (37.1 C)] 97.9 F (36.6 C) (08/24 1000) Pulse Rate:  [77-98] 93 (08/24 1000) Resp:  [10-17] 16 (08/24 1000) BP: (123-138)/(74-98) 123/85 (08/24 1000) SpO2:  [98 %-100 %] 100 % (08/24 0544) Weight:  [79.8 kg (176 lb)] 79.8 kg (176 lb) (08/23 1415)  Intake/Output from previous day: 08/23 0701 - 08/24 0700 In: 3111.7 [P.O.:540; I.V.:2471.7; IV Piggyback:100] Out: 1775 [Urine:1725; Blood:50] Intake/Output this shift: Total I/O In: 120 [P.O.:120] Out: -   No results for input(s): HGB in the last 72 hours. No results for input(s): WBC, RBC, HCT, PLT in the last 72 hours. No results for input(s): NA, K, CL, CO2, BUN, CREATININE, GLUCOSE, CALCIUM in the last 72 hours. No results for input(s): LABPT, INR in the last 72 hours.  Neurologically intact ABD soft Neurovascular intact Sensation intact distally Intact pulses distally Dorsiflexion/Plantar flexion intact Incision: dressing C/D/I and no drainage No cellulitis present Compartment soft no calf pain or sign of DVT  Assessment/Plan: 1 Day Post-Op Procedure(s) (LRB): REVISON MICRO LUMBAR DECOMPRESSION L4 - L5 ON THE RIGHT (Right) Advance diet Up with therapy D/C IV fluids Discussed D/C instructions, Lspine precautions D/C home today  BISSELL, JACLYN M. 07/12/2016, 10:20 AM

## 2016-07-12 NOTE — Care Management Note (Signed)
Case Management Note  Patient Details  Name: Victor [name] MRN: [id number] Date of Birth: Sep 27, 1966  Subjective/Objective:                  REVISON MICRO LUMBAR DECOMPRESSION L4 - L5 ON THE RIGHT (Right) Action/Plan: DISCHARGE PLANNING Expected Discharge Date:  07/12/16               Expected Discharge Plan:  Home/Self Care  In-House Referral:     Discharge planning Services  CM Consult  Post Acute Care Choice:  Durable Medical Equipment Choice offered to:  NA  DME Arranged:  Gilford Rile rolling, 3-N-1 DME Agency:  Other - Comment  HH Arranged:  NA HH Agency:  NA  Status of Service:  Completed, signed off  If discussed at [place] of Stay Meetings, dates discussed:    Additional Comments: CM met with pt in room to secure [facility] contact information.  CM called Gasper Lloyd [phone] who requested I fax orders and facesheet to her at [phone].  CM faxed facesheet, orders, H&P, OP note, and DC summary to [place] and per Amanda's request [facility] [phone] number for Amanda's agency to negotiate price with [facility] and for authorization.  CM also included [facility] DME local rep, contact number.  CM notified Estill Bamberg, pt needs DME HERE and included address and contact information as pt is discharging today.  No other CM needs were communicated. Dellie Catholic, RN 07/12/2016, 1:21 PM

## 2016-07-12 NOTE — Discharge Summary (Signed)
Physician Discharge Summary   Patient ID: Victor [name] MRN: [id number] DOB/AGE: September 28, 1966 50 y.o.  Admit date: 07/11/2016 Discharge date: 07/12/2016  Primary Diagnosis:   Stenosis L4 - L5  HNP  Admission Diagnoses:  Past Medical History:  Diagnosis Date  . Back pain   . Hypertension   . Shoulder pain    Discharge Diagnoses:   Active Problems:   Spinal stenosis of lumbar region  Procedure:  Procedure(s) (LRB): REVISON MICRO LUMBAR DECOMPRESSION L4 - L5 ON THE RIGHT (Right)   Consults: None  HPI:  see H&P    Laboratory Data: Hospital Outpatient Visit on 07/04/2016  Component Date Value Ref Range Status  . Sodium 07/04/2016 137  135 - 145 mmol/L Final  . Potassium 07/04/2016 4.0  3.5 - 5.1 mmol/L Final  . Chloride 07/04/2016 99* 101 - 111 mmol/L Final  . CO2 07/04/2016 31  22 - 32 mmol/L Final  . Glucose, Bld 07/04/2016 97  65 - 99 mg/dL Final  . BUN 07/04/2016 16  6 - 20 mg/dL Final  . Creatinine, Ser 07/04/2016 1.00  0.61 - 1.24 mg/dL Final  . Calcium 07/04/2016 9.1  8.9 - 10.3 mg/dL Final  . GFR calc non Af Amer 07/04/2016 >60  >60 mL/min Final  . GFR calc Af Amer 07/04/2016 >60  >60 mL/min Final   Comment: (NOTE) The eGFR has been calculated using the CKD EPI equation. This calculation has not been validated in all clinical situations. eGFR's persistently <60 mL/min signify possible Chronic Kidney Disease.   . Anion gap 07/04/2016 7  5 - 15 Final  . WBC 07/04/2016 7.4  4.0 - 10.5 K/uL Final  . RBC 07/04/2016 5.93* 4.22 - 5.81 MIL/uL Final  . Hemoglobin 07/04/2016 18.2* 13.0 - 17.0 g/dL Final  . HCT 07/04/2016 53.5* 39.0 - 52.0 % Final  . MCV 07/04/2016 90.2  78.0 - 100.0 fL Final  . MCH 07/04/2016 30.7  26.0 - 34.0 pg Final  . MCHC 07/04/2016 34.0  30.0 - 36.0 g/dL Final  . RDW 07/04/2016 13.3  11.5 - 15.5 % Final  . Platelets 07/04/2016 245  150 - 400 K/uL Final  . ABO/RH(D) 07/11/2016 A POS   Final  . Antibody Screen 07/11/2016 NEG   Final  .  Sample Expiration 07/11/2016 07/14/2016   Final  . Extend sample reason 07/11/2016 NO TRANSFUSIONS OR PREGNANCY IN THE PAST 3 MONTHS   Final  . MRSA, PCR 07/04/2016 NEGATIVE  NEGATIVE Final  . Staphylococcus aureus 07/04/2016 NEGATIVE  NEGATIVE Final   Comment:        The Xpert SA Assay (FDA approved for NASAL specimens in patients over 70 years of age), is one component of a comprehensive surveillance program.  Test performance has been validated by [facility] for patients greater than or equal to 72 year old. It is not intended to diagnose infection nor to guide or monitor treatment.   . ABO/RH(D) 07/04/2016 A POS   Final   No results for input(s): HGB in the last 72 hours. No results for input(s): WBC, RBC, HCT, PLT in the last 72 hours. No results for input(s): NA, K, CL, CO2, BUN, CREATININE, GLUCOSE, CALCIUM in the last 72 hours. No results for input(s): LABPT, INR in the last 72 hours.  X-Rays:Dg Lumbar Spine 2-3 Views  Result Date: 07/04/2016 CLINICAL DATA:  Preop. EXAM: LUMBAR SPINE - 2-3 VIEW COMPARISON:  CT 05/07/2016 FINDINGS: Degenerative disc disease at L4-5 with disc space narrowing. Degenerative spurring anteriorly.  Normal alignment. No fracture. SI joints are symmetric and an MR. IMPRESSION: Degenerative disc disease at L4-5.  No acute findings. Electronically Signed   By: Rolm Baptise M.D.   On: 07/04/2016 12:06   Dg Spine Portable 1 View  Result Date: 07/11/2016 CLINICAL DATA:  Intraoperative lumbar surgery. EXAM: PORTABLE SPINE - 1 VIEW COMPARISON:  Same day. FINDINGS: Single intraoperative cross-table lateral projection of the lumbar spine demonstrates surgical probe projected over the posterior margin of L4-5 disc space. IMPRESSION: Surgical localization as described above. Electronically Signed   By: Marijo Conception, M.D.   On: 07/11/2016 10:57   Dg Spine Portable 1 View  Result Date: 07/11/2016 CLINICAL DATA:  Intraoperative localization. EXAM: PORTABLE  SPINE - 1 VIEW COMPARISON:  07/04/2016. FINDINGS: Cross-table lateral portable view lumbar spine obtained 0936 hours. Same numbering scheme used on this study as on the prior exam. Soft tissue retractors noted in the lower back. Radiopaque surgical probe is positioned with the tip overlying a location just posterior to the L4-5 facet joint. IMPRESSION: Intraoperative localization. Electronically Signed   By: Misty Stanley M.D.   On: 07/11/2016 10:25    EKG: Orders placed or performed during the hospital encounter of 07/04/16  . EKG 12 lead  . EKG 12 lead     Hospital Course: Patient was admitted to [facility] and taken to the OR and underwent the above state procedure without complications.  Patient tolerated the procedure well and was later transferred to the recovery room and then to the orthopaedic floor for postoperative care.  They were given PO and IV analgesics for pain control following their surgery.  They were given 24 hours of postoperative antibiotics.   PT was consulted postop to assist with mobility and transfers.  The patient was allowed to be WBAT with therapy and was taught back precautions. Discharge planning was consulted to help with postop disposition and equipment needs.  Patient had a good night on the evening of surgery and started to get up OOB with therapy on day one. Patient was seen in rounds and was ready to go home on day one.  They were given discharge instructions and dressing directions.  They were instructed on when to follow up in the office with Dr. Tonita Cong.   Diet: Regular diet Activity:WBAT; Lspine precautions Follow-up:in 10-14 days Disposition - Home Discharged Condition: good   Discharge Instructions    Call MD / Call 911    Complete by:  As directed   If you experience chest pain or shortness of breath, CALL 911 and be transported to the hospital emergency room.  If you develope a fever above 101 F, pus (white drainage) or increased drainage or  redness at the wound, or calf pain, call your surgeon's office.   Constipation Prevention    Complete by:  As directed   Drink plenty of fluids.  Prune juice may be helpful.  You may use a stool softener, such as Colace (over the counter) 100 mg twice a day.  Use MiraLax (over the counter) for constipation as needed.   Diet - low sodium heart healthy    Complete by:  As directed   Increase activity slowly as tolerated    Complete by:  As directed       Medication List    STOP taking these medications   cloNIDine 0.1 MG tablet Commonly known as:  CATAPRES   fluticasone 50 MCG/ACT nasal spray Commonly known as:  FLONASE  gabapentin 300 MG capsule Commonly known as:  NEURONTIN   hydrOXYzine 25 MG tablet Commonly known as:  ATARAX/VISTARIL   loratadine 10 MG tablet Commonly known as:  CLARITIN   oxyCODONE-acetaminophen 10-325 MG tablet Commonly known as:  PERCOCET   predniSONE 10 MG (48) Tbpk tablet Commonly known as:  STERAPRED UNI-PAK 48 TAB   traZODone 100 MG tablet Commonly known as:  DESYREL     TAKE these medications   acetaminophen 500 MG tablet Commonly known as:  TYLENOL Take 2 tablets (1,000 mg total) by mouth every 6 (six) hours as needed for headache (Headache pain).   buPROPion 150 MG 24 hr tablet Commonly known as:  WELLBUTRIN XL Take 150 mg by mouth daily.   clonazePAM 2 MG tablet Commonly known as:  KLONOPIN Take 1 mg by mouth 2 (two) times daily as needed for anxiety. OR NERVOUSNESS   docusate sodium 100 MG capsule Commonly known as:  COLACE Take 1 capsule (100 mg total) by mouth 2 (two) times daily as needed for mild constipation.   DULoxetine 60 MG capsule Commonly known as:  CYMBALTA Take 60 mg by mouth daily.   losartan 100 MG tablet Commonly known as:  COZAAR Take 100 mg by mouth daily. for high blood pressure   nicotine 21 mg/24hr patch Commonly known as:  NICODERM CQ - dosed in mg/24 hours Place 1 patch (21 mg total) onto the skin  daily. For smoking cessation   oxyCODONE 5 MG immediate release tablet Commonly known as:  Oxy IR/ROXICODONE Take 1-2 tablets (5-10 mg total) by mouth every 4 (four) hours as needed for severe pain.   polyethylene glycol packet Commonly known as:  MIRALAX / GLYCOLAX Take 17 g by mouth daily.   traMADol 50 MG tablet Commonly known as:  ULTRAM Take 100 mg by mouth 4 (four) times daily as needed. For low back pain      Follow-up Information    BEANE,JEFFREY C, MD Follow up in 2 week(s).   Specialty:  Orthopedic Surgery Contact information: [address] [place] [address] [phone]           Signed: Lacie Draft, PA-C Orthopaedic Surgery 07/12/2016, 10:23 AM

## 2016-07-12 NOTE — Progress Notes (Signed)
RN reviewed discharge education with patient and family.  Patient waiting on equipment at this time in order to leave for home.   On coming RN aware of patient need for equipment before DC.

## 2016-07-12 NOTE — Evaluation (Signed)
Physical Therapy Evaluation Patient Details Name: Stana BuntingMartin MORALES VASQUEZ MRN: [id number] DOB: 1966-04-09 Today's Date: 07/12/2016   History of Present Illness  s/p REVISON MICRO LUMBAR DECOMPRESSION L4 - L5 ON THE RIGHT (Right)  Clinical Impression  The patient  Was trembling, able to ambulate with RW, unable without it. Recommend  RW. No further PT needs.     Follow Up Recommendations No PT follow up    Equipment Recommendations  Rolling walker with 5" wheels    Recommendations for Other Services       Precautions / Restrictions Precautions Precautions: Back;Fall Precaution Booklet Issued: Yes (comment) Precaution Comments: handout given and reviewed with patient in detail Restrictions Weight Bearing Restrictions: No      Mobility  Bed Mobility Overal bed mobility: Needs Assistance Bed Mobility: Rolling;Sidelying to Sit Rolling: Supervision Sidelying to sit: Supervision       General bed mobility comments: VC for  technique and precautions  Transfers Overall transfer level: Needs assistance Equipment used: Rolling walker (2 wheeled) Transfers: Sit to/from Stand Sit to Stand: Supervision            Ambulation/Gait Ambulation/Gait assistance: Supervision Ambulation Distance (Feet): 100 Feet Assistive device: Rolling walker (2 wheeled) Gait Pattern/deviations: Step-to pattern;Step-through pattern     General Gait Details: attempted to take a few steps without the RW but  was unable, stated too much pressure on the back.  Stairs            Wheelchair Mobility    Modified Rankin (Stroke Patients Only)       Balance                                             Pertinent Vitals/Pain Pain Assessment: Faces Faces Pain Scale: Hurts little more Pain Location: back Pain Descriptors / Indicators: Discomfort;Grimacing;Guarding Pain Intervention(s): Monitored during session;Premedicated before session;Repositioned    Home Living  Family/patient expects to be discharged to:: Private residence Living Arrangements: Spouse/significant other Available Help at Discharge: Family           Home Equipment: None      Prior Function Level of Independence: Independent         Comments: works as Psychologist, occupationalwelder     Higher education careers adviserHand Dominance        Extremity/Trunk Assessment   Upper Extremity Assessment: Defer to OT evaluation           Lower Extremity Assessment: Overall WFL for tasks assessed         Communication   Communication: No difficulties  Cognition Arousal/Alertness: Awake/alert Behavior During Therapy: WFL for tasks assessed/performed Overall Cognitive Status: Within Functional Limits for tasks assessed                      General Comments      Exercises        Assessment/Plan    PT Assessment Patent does not need any further PT services  PT Diagnosis Difficulty walking   PT Problem List    PT Treatment Interventions     PT Goals (Current goals can be found in the Care Plan section) Acute Rehab PT Goals Patient Stated Goal: home today PT Goal Formulation: All assessment and education complete, DC therapy    Frequency     Barriers to discharge        Co-evaluation  End of Session   Activity Tolerance: Patient tolerated treatment well Patient left: in chair;with call bell/phone within reach;with nursing/sitter in room;with family/visitor present Nurse Communication: Mobility status    Functional Assessment Tool Used: clinical judgement Functional Limitation: Mobility: Walking and moving around Mobility: Walking and Moving Around Current Status ([id number](G8978): At least 1 percent but less than 20 percent impaired, limited or restricted Mobility: Walking and Moving Around Goal Status [phone](G8979): At least 1 percent but less than 20 percent impaired, limited or restricted Mobility: Walking and Moving Around Discharge Status [phone]): At least 1 percent but less than 20  percent impaired, limited or restricted    Time: 1125-1138 PT Time Calculation (min) (ACUTE ONLY): 13 min   Charges:         PT G Codes:   PT G-Codes **NOT FOR INPATIENT CLASS** Functional Assessment Tool Used: clinical judgement Functional Limitation: Mobility: Walking and moving around Mobility: Walking and Moving Around Current Status ([id number](G8978): At least 1 percent but less than 20 percent impaired, limited or restricted Mobility: Walking and Moving Around Goal Status [phone]): At least 1 percent but less than 20 percent impaired, limited or restricted Mobility: Walking and Moving Around Discharge Status [phone](G8980): At least 1 percent but less than 20 percent impaired, limited or restricted    Rada HayHill, Lizandro Spellman Elizabeth 07/12/2016, 1:19 PM  Blanchard KelchKaren Connar Keating PT [phone]

## 2016-07-12 NOTE — Progress Notes (Signed)
Occupational Therapy Evaluation Patient Details Name: Victor [name] MRN: [id number] DOB: 07/13/1966 Today's Date: 07/12/2016    History of Present Illness s/p REVISON MICRO LUMBAR DECOMPRESSION L4 - L5 ON THE RIGHT (Right)   Clinical Impression   All OT education completed and pt questions answered. No further OT needs at this time. Will sign off.    Follow Up Recommendations  No OT follow up;Supervision - Intermittent    Equipment Recommendations  3 in 1 bedside comode    Recommendations for Other Services       Precautions / Restrictions Precautions Precautions: Back;Fall Precaution Booklet Issued: Yes (comment) Precaution Comments: handout given and reviewed with patient in detail Restrictions Weight Bearing Restrictions: No      Mobility Bed Mobility               General bed mobility comments: NT -- OOB  Transfers Overall transfer level: Needs assistance Equipment used: Rolling walker (2 wheeled) Transfers: Sit to/from Stand Sit to Stand: Supervision              Balance                                            ADL Overall ADL's : Needs assistance/impaired Eating/Feeding: Independent;Sitting   Grooming: Wash/dry hands;Supervision/safety;Standing       Lower Body Bathing: Minimal assistance;Adhering to back precautions;Sit to/from stand       Lower Body Dressing: Moderate assistance;Adhering to back precautions;Sit to/from stand   Toilet Transfer: Supervision/safety;Ambulation;BSC;RW   Toileting- Clothing Manipulation and Hygiene: Sit to/from stand;Minimal assistance   Tub/ Shower Transfer: Tub transfer;Minimal assistance;Ambulation;Rolling walker   Functional mobility during ADLs: Supervision/safety;Rolling walker General ADL Comments: Educated on back precautions and ADL implications. Patient practiced toilet transfer with 3 in 1 and simulated tub transfer. Would benefit from 3 in 1 at home; nurse made  aware. No further OT needs. Will sign off.     Vision     Perception     Praxis      Pertinent Vitals/Pain Pain Assessment: Faces Faces Pain Scale: Hurts little more Pain Location: back Pain Descriptors / Indicators: Grimacing;Sore Pain Intervention(s): Limited activity within patient's tolerance;Monitored during session;Repositioned     Hand Dominance     Extremity/Trunk Assessment Upper Extremity Assessment Upper Extremity Assessment: Overall WFL for tasks assessed   Lower Extremity Assessment Lower Extremity Assessment: Defer to PT evaluation       Communication Communication Communication: No difficulties   Cognition Arousal/Alertness: Awake/alert Behavior During Therapy: WFL for tasks assessed/performed Overall Cognitive Status: Within Functional Limits for tasks assessed                     General Comments       Exercises       Shoulder Instructions      Home Living Family/patient expects to be discharged to:: Private residence Living Arrangements: Spouse/significant other Available Help at Discharge: Family               Bathroom Shower/Tub: Chief Strategy OfficerTub/shower unit   Bathroom Toilet: Standard     Home Equipment: None          Prior Functioning/Environment Level of Independence: Independent        Comments: works as Manufacturing systems engineerwelder    OT Diagnosis: Acute pain   OT Problem List: Decreased knowledge of use of DME or AE;Pain;Decreased  knowledge of precautions   OT Treatment/Interventions:      OT Goals(Current goals can be found in the care plan section) Acute Rehab OT Goals Patient Stated Goal: home today OT Goal Formulation: All assessment and education complete, DC therapy  OT Frequency:     Barriers to D/C:            Co-evaluation              End of Session Equipment Utilized During Treatment: Rolling walker Nurse Communication: Other (comment) (3 in1 needed for home)  Activity Tolerance: Patient tolerated treatment  well Patient left: in chair;with call bell/phone within reach;with family/visitor present   Time: 1135-1154 OT Time Calculation (min): 19 min Charges:  OT General Charges $OT Visit: 1 Procedure OT Evaluation $OT Eval Low Complexity: 1 Procedure G-Codes: OT G-codes **NOT FOR INPATIENT CLASS** Functional Assessment Tool Used: clinical judgment Functional Limitation: Self care Self Care Current Status (W1191(G8987): At least 20 percent but less than 40 percent impaired, limited or restricted Self Care Goal Status ([id number](G8988): At least 1 percent but less than 20 percent impaired, limited or restricted Self Care Discharge Status [phone](G8989): At least 20 percent but less than 40 percent impaired, limited or restricted  Victor [name] A 07/12/2016, 12:54 PM

## 2016-09-16 ENCOUNTER — Emergency Department (HOSPITAL_COMMUNITY)
Admission: EM | Admit: 2016-09-16 | Discharge: 2016-09-16 | Disposition: A | Discharge status: Home / Self Care | Payer: Self-pay | Attending: Emergency Medicine | Admitting: Emergency Medicine

## 2016-09-16 ENCOUNTER — Encounter (HOSPITAL_COMMUNITY): Payer: Self-pay

## 2016-09-16 DIAGNOSIS — Y939 Activity, unspecified: Secondary | ICD-10-CM | POA: Insufficient documentation

## 2016-09-16 DIAGNOSIS — Z87891 Personal history of nicotine dependence: Secondary | ICD-10-CM | POA: Insufficient documentation

## 2016-09-16 DIAGNOSIS — Y999 Unspecified external cause status: Secondary | ICD-10-CM | POA: Insufficient documentation

## 2016-09-16 DIAGNOSIS — M5442 Lumbago with sciatica, left side: Secondary | ICD-10-CM | POA: Insufficient documentation

## 2016-09-16 DIAGNOSIS — X509XXA Other and unspecified overexertion or strenuous movements or postures, initial encounter: Secondary | ICD-10-CM | POA: Insufficient documentation

## 2016-09-16 DIAGNOSIS — Y929 Unspecified place or not applicable: Secondary | ICD-10-CM | POA: Insufficient documentation

## 2016-09-16 DIAGNOSIS — I10 Essential (primary) hypertension: Secondary | ICD-10-CM | POA: Insufficient documentation

## 2016-09-16 MED ORDER — CYCLOBENZAPRINE HCL 10 MG PO TABS: 10.0000 mg | ORAL_TABLET | Freq: Two times a day (BID) | ORAL | 0 refills | Status: DC | PRN

## 2016-09-16 MED ORDER — KETOROLAC TROMETHAMINE 60 MG/2ML IM SOLN
60.0000 mg | Freq: Once | INTRAMUSCULAR | Status: AC
  Administered 2016-09-16: 60 mg via INTRAMUSCULAR
  Filled 2016-09-16: qty 2

## 2016-09-16 MED ORDER — ORPHENADRINE CITRATE 30 MG/ML IJ SOLN
60.0000 mg | Freq: Once | INTRAMUSCULAR | Status: DC
  Filled 2016-09-16: qty 2

## 2016-09-16 MED ORDER — LIDOCAINE 5 % EX OINT: 1.0000 "application " | TOPICAL_OINTMENT | CUTANEOUS | 0 refills | Status: DC | PRN

## 2016-09-16 MED ORDER — CYCLOBENZAPRINE HCL 10 MG PO TABS
10.0000 mg | ORAL_TABLET | Freq: Once | ORAL | Status: AC
  Administered 2016-09-16: 10 mg via ORAL
  Filled 2016-09-16: qty 1

## 2016-09-16 NOTE — ED Provider Notes (Signed)
[facility]ENCY DEPT Provider Note   CSN: [id number] Arrival date & time: 09/16/16  1343     History   Chief Complaint Chief Complaint  Patient presents with  . Back Pain    HPI Victor [name] is a 50 y.o. male.  Patient presents with left-sided lumbar/gluteal back pain radiating down his left buttock after he was twisting the other day and felt a "pop". Had spinal surgery two months ago and follows with a spine surgeon. He denies fevers, denies IV drug use. Denies bowel or bladder incontinence, denies saddle anesthesia.   The history is provided by the patient. No language interpreter was used.  Back Pain   This is a new problem. The current episode started 2 days ago. The problem occurs constantly. The problem has not changed since onset.The pain is associated with twisting. The pain is present in the lumbar spine. The quality of the pain is described as stabbing. The pain radiates to the left thigh. The pain is at a severity of 10/10. The pain is moderate. The symptoms are aggravated by twisting and certain positions. The pain is the same all the time. Pertinent negatives include no fever.    Past Medical History:  Diagnosis Date  . Back pain   . Hypertension   . Shoulder pain     Patient Active Problem List   Diagnosis Date Noted  . Spinal stenosis of lumbar region 07/11/2016  . Depression, major, recurrent, moderate (HCC) 01/18/2016  . Opioid dependence with withdrawal (HCC) 01/18/2016    Past Surgical History:  Procedure Laterality Date  . BACK SURGERY    . LUMBAR LAMINECTOMY/DECOMPRESSION MICRODISCECTOMY Right 07/11/2016   Procedure: REVISON MICRO LUMBAR DECOMPRESSION L4 - L5 ON THE RIGHT;  Surgeon: Jene EveryJeffrey Beane, MD;  Location: [facility];  Service: Orthopedics;  Laterality: Right;  . ROTATOR CUFF REPAIR         Home Medications    Prior to Admission medications   Medication Sig Start Date End Date Taking? Authorizing Provider  acetaminophen (TYLENOL)  500 MG tablet Take 2 tablets (1,000 mg total) by mouth every 6 (six) hours as needed for headache (Headache pain). Patient not taking: Reported on 07/04/2016 01/20/16   Sanjuana KavaAgnes I Nwoko, NP  buPROPion (WELLBUTRIN XL) 150 MG 24 hr tablet Take 150 mg by mouth daily.    Historical Provider, MD  clonazePAM (KLONOPIN) 2 MG tablet Take 1 mg by mouth 2 (two) times daily as needed for anxiety. OR NERVOUSNESS 06/08/16   Historical Provider, MD  cyclobenzaprine (FLEXERIL) 10 MG tablet Take 1 tablet (10 mg total) by mouth 2 (two) times daily as needed for muscle spasms. 09/16/16   Preston FleetingAnna Samarra Ridgely, MD  docusate sodium (COLACE) 100 MG capsule Take 1 capsule (100 mg total) by mouth 2 (two) times daily as needed for mild constipation. 07/11/16   Jene EveryJeffrey Beane, MD  DULoxetine (CYMBALTA) 60 MG capsule Take 60 mg by mouth daily. 06/20/16   Historical Provider, MD  lidocaine (XYLOCAINE) 5 % ointment Apply 1 application topically as needed. 09/16/16   Preston FleetingAnna Kriya Westra, MD  losartan (COZAAR) 100 MG tablet Take 100 mg by mouth daily. for high blood pressure 05/08/16   Historical Provider, MD  nicotine (NICODERM CQ - DOSED IN MG/24 HOURS) 21 mg/24hr patch Place 1 patch (21 mg total) onto the skin daily. For smoking cessation Patient not taking: Reported on 07/04/2016 01/20/16   Sanjuana KavaAgnes I Nwoko, NP  oxyCODONE (OXY IR/ROXICODONE) 5 MG immediate release tablet Take 1-2 tablets (5-10 mg  total) by mouth every 4 (four) hours as needed for severe pain. 07/11/16   Jene Every, MD  polyethylene glycol [facility] / GLYCOLAX) packet Take 17 g by mouth daily. 07/11/16   Jene Every, MD  traMADol (ULTRAM) 50 MG tablet Take 100 mg by mouth 4 (four) times daily as needed. For low back pain 06/08/16   Historical Provider, MD    Family History No family history on file.  Social History Social History  Substance Use Topics  . Smoking status: Former Smoker    Packs/day: 0.20    Years: 2.00    Types: Cigarettes  . Smokeless tobacco: Never Used  .  Alcohol use No     Allergies   Nsaids and Tylenol [acetaminophen]   Review of Systems Review of Systems  Constitutional: Negative for fever.  HENT: Negative.   Respiratory: Negative.   Cardiovascular: Negative.   Gastrointestinal: Negative.   Genitourinary: Negative for difficulty urinating.  Musculoskeletal: Positive for back pain.  Skin: Negative.   Neurological: Negative.   Hematological: Does not bruise/bleed easily.  Psychiatric/Behavioral: Negative.      Physical Exam Updated Vital Signs BP 128/99 (BP Location: Right Arm)   Pulse 113   Temp 98.8 F (37.1 C) (Oral)   Resp 16   SpO2 96%   Physical Exam  Constitutional: He is oriented to person, place, and time. He appears well-developed and well-nourished. No distress.  HENT:  Head: Normocephalic and atraumatic.  Eyes: Conjunctivae and EOM are normal. Pupils are equal, round, and reactive to light.  Neck: Normal range of motion. Neck supple.  Cardiovascular: Normal rate and regular rhythm.   Pulmonary/Chest: Effort normal. No respiratory distress.  Musculoskeletal: Normal range of motion.  Neurological: He is alert and oriented to person, place, and time.  5/5 strength of hip flexion/extension, knee flexion/extension, dorsiflexion/plantarflexion. Sensation intact in all distal nerve distributions. Ambulatory with a steady gait, no foot drop.  Skin: Skin is warm and dry. He is not diaphoretic.     ED Treatments / Results  Labs (all labs ordered are listed, but only abnormal results are displayed) Labs Reviewed - No data to display  EKG  EKG Interpretation None       Radiology No results found.  Procedures Procedures (including critical care time)  Medications Ordered in ED Medications  ketorolac (TORADOL) injection 60 mg (60 mg Intramuscular Given 09/16/16 1553)  cyclobenzaprine (FLEXERIL) tablet 10 mg (10 mg Oral Given 09/16/16 1552)     Initial Impression / Assessment and Plan / ED Course    I have reviewed the triage vital signs and the nursing notes.  Pertinent labs & imaging results that were available during my care of the patient were reviewed by me and considered in my medical decision making (see chart for details).  Clinical Course    Patient presents with worsening low back pain and symptoms of left-sided sciatic after twisting and hearing a "pop". No falls or other injuries. No red flag symptoms of incontinence or saddle anesthesia. He has a normal neurological examination and no midline tenderness. Do not suspect cauda equina syndrome. No fevers, do not suspect diskitis or osteomyelitis. Straight leg raise negative. Feel his symptoms are likely musculoskeletal. He was given toradol and flexeril here, and will be prescribed a short course of flexeril and lidocaine ointment for symptom relief. Of note, patient is tachycardic on arrival here. He notes that he feels anxious. Denies SI, HI, AVH. He does have a history of opiate withdrawal and this  may be due to opiate withdrawal though the patient adamantly denies this to me. He was instructed to follow up with his spine surgeon and given strict return precautions for worsening symptoms. He expressed understanding and is in good condition for discharge home.  Final Clinical Impressions(s) / ED Diagnoses   Final diagnoses:  Acute left-sided low back pain with left-sided sciatica    New Prescriptions Discharge Medication List as of 09/16/2016  4:22 PM    START taking these medications   Details  cyclobenzaprine (FLEXERIL) 10 MG tablet Take 1 tablet (10 mg total) by mouth 2 (two) times daily as needed for muscle spasms., Starting Sun 09/16/2016, Print    lidocaine (XYLOCAINE) 5 % ointment Apply 1 application topically as needed., Starting Sun 09/16/2016, Print         Preston FleetingAnna Iliyana Convey, MD 09/17/16 [id number]    Nelva Nayobert Beaton, MD 10/17/16 [phone]

## 2016-09-16 NOTE — ED Notes (Signed)
MD at bedside. 

## 2016-09-16 NOTE — ED Triage Notes (Signed)
Patient here with lower back pain and radiation down right leg x 2 days after hearing a pop when he got out of bed. Patient also complains that his anxiety is worse since his back surgery in august. denies suicidal ideation. Alert and oriented

## 2017-06-04 ENCOUNTER — Other Ambulatory Visit: Payer: Self-pay | Admitting: Family Medicine

## 2017-06-04 DIAGNOSIS — E049 Nontoxic goiter, unspecified: Secondary | ICD-10-CM

## 2017-12-03 DIAGNOSIS — G8929 Other chronic pain: Secondary | ICD-10-CM

## 2017-12-03 DIAGNOSIS — M961 Postlaminectomy syndrome, not elsewhere classified: Secondary | ICD-10-CM | POA: Insufficient documentation

## 2017-12-03 DIAGNOSIS — M545 Low back pain, unspecified: Secondary | ICD-10-CM | POA: Insufficient documentation

## 2017-12-03 HISTORY — DX: Low back pain, unspecified: M54.50

## 2017-12-03 HISTORY — DX: Postlaminectomy syndrome, not elsewhere classified: M96.1

## 2017-12-03 HISTORY — DX: Other chronic pain: G89.29

## 2017-12-09 DIAGNOSIS — M51369 Other intervertebral disc degeneration, lumbar region without mention of lumbar back pain or lower extremity pain: Secondary | ICD-10-CM

## 2017-12-09 DIAGNOSIS — M5136 Other intervertebral disc degeneration, lumbar region: Secondary | ICD-10-CM

## 2017-12-09 HISTORY — DX: Other intervertebral disc degeneration, lumbar region without mention of lumbar back pain or lower extremity pain: M51.369

## 2017-12-09 HISTORY — DX: Other intervertebral disc degeneration, lumbar region: M51.36

## 2017-12-23 DIAGNOSIS — Z1211 Encounter for screening for malignant neoplasm of colon: Secondary | ICD-10-CM | POA: Diagnosis not present

## 2017-12-23 DIAGNOSIS — M5116 Intervertebral disc disorders with radiculopathy, lumbar region: Secondary | ICD-10-CM | POA: Diagnosis not present

## 2017-12-23 DIAGNOSIS — G894 Chronic pain syndrome: Secondary | ICD-10-CM | POA: Diagnosis not present

## 2017-12-23 DIAGNOSIS — Z6826 Body mass index (BMI) 26.0-26.9, adult: Secondary | ICD-10-CM | POA: Diagnosis not present

## 2018-01-07 DIAGNOSIS — Z1211 Encounter for screening for malignant neoplasm of colon: Secondary | ICD-10-CM | POA: Diagnosis not present

## 2018-01-07 DIAGNOSIS — Z1212 Encounter for screening for malignant neoplasm of rectum: Secondary | ICD-10-CM | POA: Diagnosis not present

## 2018-01-24 DIAGNOSIS — E349 Endocrine disorder, unspecified: Secondary | ICD-10-CM | POA: Diagnosis not present

## 2018-01-24 DIAGNOSIS — I10 Essential (primary) hypertension: Secondary | ICD-10-CM | POA: Diagnosis not present

## 2018-01-24 DIAGNOSIS — F419 Anxiety disorder, unspecified: Secondary | ICD-10-CM | POA: Diagnosis not present

## 2018-01-24 DIAGNOSIS — E663 Overweight: Secondary | ICD-10-CM | POA: Diagnosis not present

## 2018-01-24 DIAGNOSIS — R110 Nausea: Secondary | ICD-10-CM | POA: Diagnosis not present

## 2018-04-10 IMAGING — DX DG SPINE 1V PORT
1 series · 1 of 1 positions shown · non-contrast
Comparison: 07/04/2016.

CLINICAL DATA: Intraoperative localization.

EXAM:
PORTABLE SPINE - 1 VIEW

[l-spine lat]
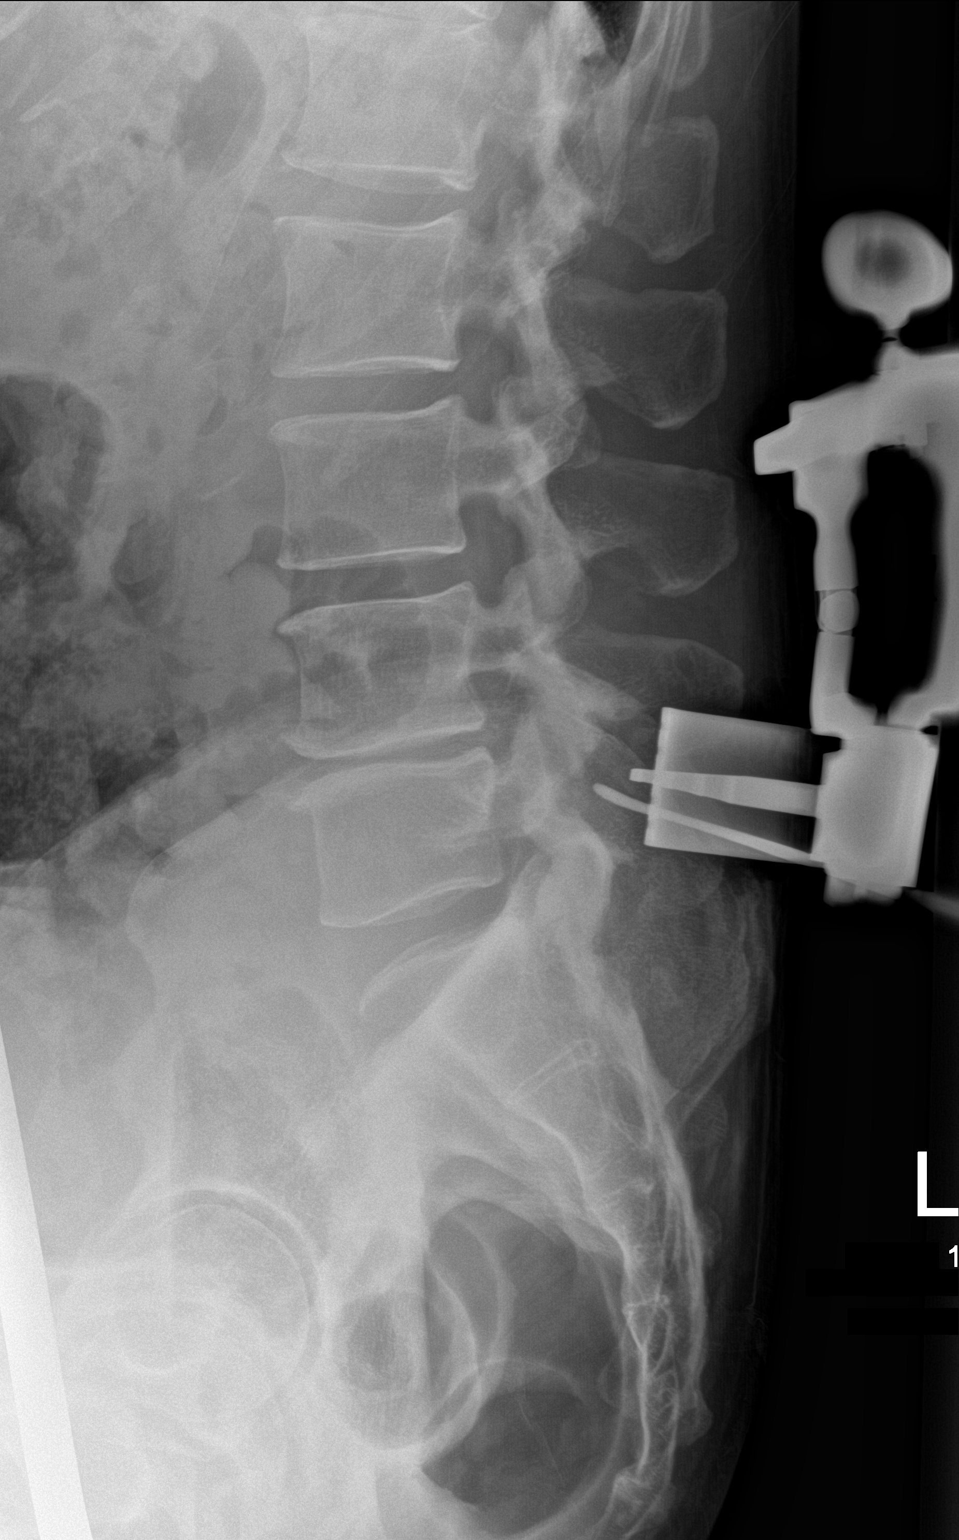

[1 of 1 positions shown; findings below may reference images not displayed]

FINDINGS: Cross-table lateral portable view lumbar spine obtained 7105 hours.
Same numbering scheme used on this study as on the prior exam. Soft
tissue retractors noted in the lower back. Radiopaque surgical probe
is positioned with the tip overlying a location just posterior to
the L4-5 facet joint.
IMPRESSION: Intraoperative localization.

## 2018-05-07 DIAGNOSIS — F1120 Opioid dependence, uncomplicated: Secondary | ICD-10-CM | POA: Diagnosis not present

## 2018-05-07 DIAGNOSIS — M5116 Intervertebral disc disorders with radiculopathy, lumbar region: Secondary | ICD-10-CM | POA: Diagnosis not present

## 2018-07-30 DIAGNOSIS — Z125 Encounter for screening for malignant neoplasm of prostate: Secondary | ICD-10-CM | POA: Diagnosis not present

## 2018-07-30 DIAGNOSIS — M5116 Intervertebral disc disorders with radiculopathy, lumbar region: Secondary | ICD-10-CM | POA: Diagnosis not present

## 2018-07-30 DIAGNOSIS — Z79899 Other long term (current) drug therapy: Secondary | ICD-10-CM | POA: Diagnosis not present

## 2018-07-30 DIAGNOSIS — E349 Endocrine disorder, unspecified: Secondary | ICD-10-CM | POA: Diagnosis not present

## 2018-07-30 DIAGNOSIS — Z23 Encounter for immunization: Secondary | ICD-10-CM | POA: Diagnosis not present

## 2018-07-30 DIAGNOSIS — F1120 Opioid dependence, uncomplicated: Secondary | ICD-10-CM | POA: Diagnosis not present

## 2018-07-30 DIAGNOSIS — I10 Essential (primary) hypertension: Secondary | ICD-10-CM | POA: Diagnosis not present

## 2018-08-25 DIAGNOSIS — E349 Endocrine disorder, unspecified: Secondary | ICD-10-CM | POA: Diagnosis not present

## 2018-08-25 DIAGNOSIS — Z79899 Other long term (current) drug therapy: Secondary | ICD-10-CM | POA: Diagnosis not present

## 2018-10-29 DIAGNOSIS — Z6826 Body mass index (BMI) 26.0-26.9, adult: Secondary | ICD-10-CM | POA: Diagnosis not present

## 2018-10-29 DIAGNOSIS — Z1331 Encounter for screening for depression: Secondary | ICD-10-CM | POA: Diagnosis not present

## 2018-10-29 DIAGNOSIS — Z79899 Other long term (current) drug therapy: Secondary | ICD-10-CM | POA: Diagnosis not present

## 2018-10-29 DIAGNOSIS — E349 Endocrine disorder, unspecified: Secondary | ICD-10-CM | POA: Diagnosis not present

## 2018-10-29 DIAGNOSIS — R350 Frequency of micturition: Secondary | ICD-10-CM | POA: Diagnosis not present

## 2018-12-23 DIAGNOSIS — Z6827 Body mass index (BMI) 27.0-27.9, adult: Secondary | ICD-10-CM | POA: Diagnosis not present

## 2018-12-23 DIAGNOSIS — I10 Essential (primary) hypertension: Secondary | ICD-10-CM | POA: Diagnosis not present

## 2018-12-23 DIAGNOSIS — M5412 Radiculopathy, cervical region: Secondary | ICD-10-CM | POA: Diagnosis not present

## 2019-02-13 DIAGNOSIS — E349 Endocrine disorder, unspecified: Secondary | ICD-10-CM | POA: Diagnosis not present

## 2019-02-13 DIAGNOSIS — I10 Essential (primary) hypertension: Secondary | ICD-10-CM | POA: Diagnosis not present

## 2019-02-13 DIAGNOSIS — Z79899 Other long term (current) drug therapy: Secondary | ICD-10-CM | POA: Diagnosis not present

## 2019-02-19 DIAGNOSIS — I10 Essential (primary) hypertension: Secondary | ICD-10-CM | POA: Diagnosis not present

## 2019-02-19 DIAGNOSIS — M5116 Intervertebral disc disorders with radiculopathy, lumbar region: Secondary | ICD-10-CM | POA: Diagnosis not present

## 2019-02-19 DIAGNOSIS — E349 Endocrine disorder, unspecified: Secondary | ICD-10-CM | POA: Diagnosis not present

## 2019-05-18 DIAGNOSIS — M25561 Pain in right knee: Secondary | ICD-10-CM | POA: Diagnosis not present

## 2019-05-18 DIAGNOSIS — M25562 Pain in left knee: Secondary | ICD-10-CM | POA: Diagnosis not present

## 2019-05-18 DIAGNOSIS — E349 Endocrine disorder, unspecified: Secondary | ICD-10-CM | POA: Diagnosis not present

## 2019-05-18 DIAGNOSIS — I10 Essential (primary) hypertension: Secondary | ICD-10-CM | POA: Diagnosis not present

## 2019-08-20 DIAGNOSIS — I10 Essential (primary) hypertension: Secondary | ICD-10-CM | POA: Diagnosis not present

## 2019-08-20 DIAGNOSIS — Z125 Encounter for screening for malignant neoplasm of prostate: Secondary | ICD-10-CM | POA: Diagnosis not present

## 2019-08-20 DIAGNOSIS — E349 Endocrine disorder, unspecified: Secondary | ICD-10-CM | POA: Diagnosis not present

## 2019-08-20 DIAGNOSIS — E7800 Pure hypercholesterolemia, unspecified: Secondary | ICD-10-CM | POA: Diagnosis not present

## 2019-08-20 DIAGNOSIS — Z23 Encounter for immunization: Secondary | ICD-10-CM | POA: Diagnosis not present

## 2019-08-20 DIAGNOSIS — Z79899 Other long term (current) drug therapy: Secondary | ICD-10-CM | POA: Diagnosis not present

## 2019-08-20 DIAGNOSIS — M5116 Intervertebral disc disorders with radiculopathy, lumbar region: Secondary | ICD-10-CM | POA: Diagnosis not present

## 2019-10-12 DIAGNOSIS — Z79899 Other long term (current) drug therapy: Secondary | ICD-10-CM | POA: Diagnosis not present

## 2019-10-12 DIAGNOSIS — E349 Endocrine disorder, unspecified: Secondary | ICD-10-CM | POA: Diagnosis not present

## 2019-11-04 DIAGNOSIS — E349 Endocrine disorder, unspecified: Secondary | ICD-10-CM | POA: Diagnosis not present

## 2019-11-04 DIAGNOSIS — Z79899 Other long term (current) drug therapy: Secondary | ICD-10-CM | POA: Diagnosis not present

## 2020-02-11 DIAGNOSIS — E349 Endocrine disorder, unspecified: Secondary | ICD-10-CM | POA: Diagnosis not present

## 2020-02-11 DIAGNOSIS — I10 Essential (primary) hypertension: Secondary | ICD-10-CM | POA: Diagnosis not present

## 2020-02-11 DIAGNOSIS — M5116 Intervertebral disc disorders with radiculopathy, lumbar region: Secondary | ICD-10-CM | POA: Diagnosis not present

## 2020-02-11 DIAGNOSIS — Z79899 Other long term (current) drug therapy: Secondary | ICD-10-CM | POA: Diagnosis not present

## 2020-02-11 DIAGNOSIS — M25562 Pain in left knee: Secondary | ICD-10-CM | POA: Diagnosis not present

## 2020-02-11 DIAGNOSIS — E7800 Pure hypercholesterolemia, unspecified: Secondary | ICD-10-CM | POA: Diagnosis not present

## 2020-02-19 DIAGNOSIS — M25562 Pain in left knee: Secondary | ICD-10-CM | POA: Diagnosis not present

## 2020-03-25 DIAGNOSIS — M25562 Pain in left knee: Secondary | ICD-10-CM | POA: Diagnosis not present

## 2020-03-25 DIAGNOSIS — Z6827 Body mass index (BMI) 27.0-27.9, adult: Secondary | ICD-10-CM | POA: Diagnosis not present

## 2020-03-25 DIAGNOSIS — F411 Generalized anxiety disorder: Secondary | ICD-10-CM | POA: Diagnosis not present

## 2020-04-08 DIAGNOSIS — M1712 Unilateral primary osteoarthritis, left knee: Secondary | ICD-10-CM | POA: Diagnosis not present

## 2020-04-08 DIAGNOSIS — Z79899 Other long term (current) drug therapy: Secondary | ICD-10-CM | POA: Diagnosis not present

## 2020-04-08 DIAGNOSIS — M5116 Intervertebral disc disorders with radiculopathy, lumbar region: Secondary | ICD-10-CM | POA: Diagnosis not present

## 2020-04-08 DIAGNOSIS — E349 Endocrine disorder, unspecified: Secondary | ICD-10-CM | POA: Diagnosis not present

## 2020-04-08 DIAGNOSIS — I10 Essential (primary) hypertension: Secondary | ICD-10-CM | POA: Diagnosis not present

## 2020-04-08 DIAGNOSIS — E7800 Pure hypercholesterolemia, unspecified: Secondary | ICD-10-CM | POA: Diagnosis not present

## 2020-08-25 DIAGNOSIS — I10 Essential (primary) hypertension: Secondary | ICD-10-CM | POA: Diagnosis not present

## 2020-08-25 DIAGNOSIS — E349 Endocrine disorder, unspecified: Secondary | ICD-10-CM | POA: Diagnosis not present

## 2020-08-25 DIAGNOSIS — E7800 Pure hypercholesterolemia, unspecified: Secondary | ICD-10-CM | POA: Diagnosis not present

## 2020-08-25 DIAGNOSIS — Z79899 Other long term (current) drug therapy: Secondary | ICD-10-CM | POA: Diagnosis not present

## 2020-08-25 DIAGNOSIS — F411 Generalized anxiety disorder: Secondary | ICD-10-CM | POA: Diagnosis not present

## 2020-08-25 DIAGNOSIS — Z125 Encounter for screening for malignant neoplasm of prostate: Secondary | ICD-10-CM | POA: Diagnosis not present

## 2020-08-25 DIAGNOSIS — R0789 Other chest pain: Secondary | ICD-10-CM | POA: Diagnosis not present

## 2021-04-03 ENCOUNTER — Encounter: Payer: Self-pay | Admitting: *Deleted

## 2021-04-03 ENCOUNTER — Encounter: Payer: Self-pay | Admitting: Cardiology

## 2021-04-03 DIAGNOSIS — K219 Gastro-esophageal reflux disease without esophagitis: Secondary | ICD-10-CM | POA: Insufficient documentation

## 2021-04-03 DIAGNOSIS — I10 Essential (primary) hypertension: Secondary | ICD-10-CM | POA: Insufficient documentation

## 2021-04-07 ENCOUNTER — Encounter: Payer: Self-pay | Admitting: Cardiology

## 2021-04-07 ENCOUNTER — Ambulatory Visit: Payer: BC Managed Care – PPO | Admitting: Cardiology

## 2021-04-07 ENCOUNTER — Other Ambulatory Visit: Payer: Self-pay

## 2021-04-07 VITALS — BP 141/98 | HR 76 | Ht 71.0 in | Wt 178.2 lb

## 2021-04-07 DIAGNOSIS — E782 Mixed hyperlipidemia: Secondary | ICD-10-CM

## 2021-04-07 DIAGNOSIS — F17210 Nicotine dependence, cigarettes, uncomplicated: Secondary | ICD-10-CM

## 2021-04-07 DIAGNOSIS — R079 Chest pain, unspecified: Secondary | ICD-10-CM

## 2021-04-07 DIAGNOSIS — R011 Cardiac murmur, unspecified: Secondary | ICD-10-CM

## 2021-04-07 DIAGNOSIS — I10 Essential (primary) hypertension: Secondary | ICD-10-CM

## 2021-04-07 DIAGNOSIS — R0789 Other chest pain: Secondary | ICD-10-CM

## 2021-04-07 HISTORY — DX: Nicotine dependence, cigarettes, uncomplicated: F17.210

## 2021-04-07 HISTORY — DX: Chest pain, unspecified: R07.9

## 2021-04-07 HISTORY — DX: Mixed hyperlipidemia: E78.2

## 2021-04-07 HISTORY — DX: Cardiac murmur, unspecified: R01.1

## 2021-04-07 MED ORDER — NITROGLYCERIN 0.4 MG SL SUBL: 0.4000 mg | SUBLINGUAL_TABLET | SUBLINGUAL | 6 refills | Status: DC | PRN

## 2021-04-07 NOTE — Progress Notes (Signed)
Cardiology Office Note:    Date:  04/07/2021   ID:  Victor [name], DOB [birth year], MRN [id number]  PCP:  Lonie Peak, PA-C  Cardiologist:  Garwin Brothers, MD   Referring MD: Lonie Peak, PA-C    ASSESSMENT:    1. Hypertension, benign   2. Chest pain of uncertain etiology   3. Mixed dyslipidemia   4. Cigarette smoker   5. Cardiac murmur    PLAN:    In order of problems listed above:  1. Chest pain: Primary prevention stressed with the patient.  Importance of compliance with diet medication stressed any vocalized understanding.  Sublingual nitroglycerin prescription was sent, its protocol and 911 protocol explained and the patient vocalized understanding questions were answered to the patient's satisfaction.  He is symptoms are atypical however he has multiple risk factors and therefore we will do a Lexiscan sestamibi. 2. Essential hypertension: Blood pressure stable and diet was emphasized.  He has an element of whitecoat hypertension and appears anxious.  He will keep a track of his blood pressure at home.  Diet, salt intake issues and lifestyle modification urged 3. Mixed dyslipidemia: Diet was emphasized.  Lipids are followed by primary care physician. 4. Cardiac murmur: Echocardiogram will be done to assess murmur heard on auscultation. 5. Cigarette smoker: I spent 5 minutes with the patient discussing solely about smoking. Smoking cessation was counseled. I suggested to the patient also different medications and pharmacological interventions. Patient is keen to try stopping on its own at this time. He will get back to me if he needs any further assistance in this matter. 6. Patient will be seen in follow-up appointment in 6 months or earlier if the patient has any concerns    Medication Adjustments/Labs and Tests Ordered: Current medicines are reviewed at length with the patient today.  Concerns regarding medicines are outlined above.  No orders of the defined  types were placed in this encounter.  No orders of the defined types were placed in this encounter.    History of Present Illness:    Victor [name] is a 55 y.o. male who is being seen today for the evaluation of chest pain at the request of Lonie Peak, [place].  Patient is a pleasant 55 year old male.  He has past medical history of essential hypertension, dyslipidemia and cigarette smoking.  He leads a sedentary lifestyle.  He is a Psychologist, occupational by profession.  He tells me that at times his blood pressure will go up to 200.  This is especially when he is anxious.  With that he has some chest tightness.  No radiation to the neck or to the arms.  He does not exercise on a regular basis.  He is sexually active but has significant sexual dysfunction and tells me that it prevents him from having a complete intercourse.  At the time of my evaluation, the patient is alert awake oriented and in no distress.  Past Medical History:  Diagnosis Date  . Anxiety and depression   . Back pain   . Depression, major, recurrent, moderate (HCC) 01/18/2016  . Epicondylitis, lateral   . GAD (generalized anxiety disorder)   . GERD (gastroesophageal reflux disease)   . Hypercholesterolemia   . Hypertension, benign   . Lumbar disc disease with radiculopathy   . Opioid dependence with withdrawal (HCC) 01/18/2016  . Osteoarthritis of left knee   . Shoulder pain   . Spinal stenosis of lumbar region 07/11/2016  . Testosterone deficiency  Past Surgical History:  Procedure Laterality Date  . BACK SURGERY    . LUMBAR LAMINECTOMY/DECOMPRESSION MICRODISCECTOMY Right 07/11/2016   Procedure: REVISON MICRO LUMBAR DECOMPRESSION L4 - L5 ON THE RIGHT;  Surgeon: Jene Every, MD;  Location: [facility];  Service: Orthopedics;  Laterality: Right;  . ROTATOR CUFF REPAIR      Current Medications: Current Meds  Medication Sig  . acetaminophen (TYLENOL) 500 MG tablet Take 500 mg by mouth 2 (two) times daily as needed for  moderate pain or headache.  Marland Kitchen amLODipine (NORVASC) 10 MG tablet Take 1 tablet by mouth daily.  . baclofen (LIORESAL) 20 MG tablet Take 20 mg by mouth 2 (two) times daily as needed for muscle spasms.  . busPIRone (BUSPAR) 15 MG tablet Take 15 mg by mouth 3 (three) times daily as needed (anxiety).  . hydrochlorothiazide (HYDRODIURIL) 25 MG tablet Take 25 mg by mouth daily.  Marland Kitchen ibuprofen (ADVIL) 800 MG tablet Take 800 mg by mouth every 8 (eight) hours as needed for moderate pain (inflammation).  Marland Kitchen losartan (COZAAR) 100 MG tablet Take 100 mg by mouth daily. for high blood pressure  . metoprolol succinate (TOPROL-XL) 50 MG 24 hr tablet Take 50 mg by mouth daily.  Marland Kitchen omeprazole (PRILOSEC) 40 MG capsule Take 40 mg by mouth daily.  . rosuvastatin (CRESTOR) 10 MG tablet Take 10 mg by mouth daily.  . Testosterone Cypionate 200 MG/ML SOLN Inject 100 mLs as directed every 14 (fourteen) days.     Allergies:   Celebrex [celecoxib], Gabapentin, Nsaids, and Tylenol [acetaminophen]   Social History   Socioeconomic History  . Marital status: Married    Spouse name: Not on file  . Number of children: Not on file  . Years of education: Not on file  . Highest education level: Not on file  Occupational History  . Not on file  Tobacco Use  . Smoking status: Former Smoker    Packs/day: 0.20    Years: 2.00    Pack years: 0.40    Types: Cigarettes  . Smokeless tobacco: Never Used  Substance and Sexual Activity  . Alcohol use: No  . Drug use: Yes    Comment: Opiate pills  . Sexual activity: Not on file  Other Topics Concern  . Not on file  Social History Narrative  . Not on file   Social Determinants of Health   Financial Resource Strain: Not on file  Food Insecurity: Not on file  Transportation Needs: Not on file  Physical Activity: Not on file  Stress: Not on file  Social Connections: Not on file     Family History: The patient's Family history is unknown by patient.  ROS:   Please see  the history of present illness.    All other systems reviewed and are negative.  EKGs/Labs/Other Studies Reviewed:    The following studies were reviewed today: EKG reveals sinus rhythm and nonspecific ST-T changes.   Recent Labs: No results found for requested labs within last 8760 hours.  Recent Lipid Panel No results found for: CHOL, TRIG, HDL, CHOLHDL, VLDL, LDLCALC, LDLDIRECT  Physical Exam:    VS:  BP (!) 141/98   Pulse 76   Ht 5\' 11"  (1.803 m)   Wt 178 lb 3.2 oz (80.8 kg)   SpO2 96%   BMI 24.85 kg/m     Wt Readings from Last 3 Encounters:  04/07/21 178 lb 3.2 oz (80.8 kg)  03/28/21 184 lb (83.5 kg)  07/11/16 176 lb (79.8 kg)  GEN: Patient is in no acute distress HEENT: Normal NECK: No JVD; No carotid bruits LYMPHATICS: No lymphadenopathy CARDIAC: S1 S2 regular, 2/6 systolic murmur at the apex. RESPIRATORY:  Clear to auscultation without rales, wheezing or rhonchi  ABDOMEN: Soft, non-tender, non-distended MUSCULOSKELETAL:  No edema; No deformity  SKIN: Warm and dry NEUROLOGIC:  Alert and oriented x 3 PSYCHIATRIC:  Normal affect    Signed, Garwin Brothers, MD  04/07/2021 2:12 PM    [facility]

## 2021-04-07 NOTE — Patient Instructions (Signed)
Medication Instructions:  Your physician has recommended you make the following change in your medication:   Use nitroglycerin 1 tablet placed under the tongue at the first sign of chest pain or an angina attack. 1 tablet may be used every 5 minutes as needed, for up to 15 minutes. Do not take more than 3 tablets in 15 minutes. If pain persist call 911 or go to the nearest ED.   *If you need a refill on your cardiac medications before your next appointment, please call your pharmacy*   Lab Work: None ordered If you have labs (blood work) drawn today and your tests are completely normal, you will receive your results only by: Marland Kitchen. MyChart Message (if you have MyChart) OR . A paper copy in the mail If you have any lab test that is abnormal or we need to change your treatment, we will call you to review the results.   Testing/Procedures: Your physician has requested that you have a lexiscan myoview. For further information please visit [email or web address]. Please follow instruction sheet, as given.  The test will take approximately 3 to 4 hours to complete; you may bring reading material.  If someone comes with you to your appointment, they will need to remain in the main lobby due to limited space in the testing area.*  How to prepare for your Myocardial Perfusion Test: . Do not eat or drink 3 hours prior to your test, except you may have water. . Do not consume products containing caffeine (regular or decaffeinated) 12 hours prior to your test. (ex: coffee, chocolate, sodas, tea). . Do bring a list of your current medications with you.  If not listed below, you may take your medications as normal. . Do wear comfortable clothes (no dresses or overalls) and walking shoes, tennis shoes preferred (No heels or open toe shoes are allowed). . Do NOT wear cologne, perfume, aftershave, or lotions (deodorant is allowed). . If these instructions are not followed, your test will have to be  rescheduled.  Your physician has requested that you have an echocardiogram. Echocardiography is a painless test that uses sound waves to create images of your heart. It provides your doctor with information about the size and shape of your heart and how well your heart's chambers and valves are working. This procedure takes approximately one hour. There are no restrictions for this procedure.   Follow-Up: At [facility], you and your health needs are our priority.  As part of our continuing mission to provide you with exceptional heart care, we have created designated Provider Care Teams.  These Care Teams include your primary Cardiologist (physician) and Advanced Practice Providers (APPs -  Physician Assistants and Nurse Practitioners) who all work together to provide you with the care you need, when you need it.  We recommend signing up for the patient portal called "MyChart".  Sign up information is provided on this After Visit Summary.  MyChart is used to connect with patients for Virtual Visits (Telemedicine).  Patients are able to view lab/test results, encounter notes, upcoming appointments, etc.  Non-urgent messages can be sent to your provider as well.   To learn more about what you can do with MyChart, go to [email or web address].    Your next appointment:   3 month(s)  The format for your next appointment:   In Person  Provider:   Belva Cromeajan Revankar, MD   Other Instructions Cardiac Nuclear Scan A cardiac nuclear scan is a test that is done to check the  flow of blood to your heart. It is done when you are resting and when you are exercising. The test looks for problems such as:  Not enough blood reaching a portion of the heart.  The heart muscle not working as it should. You may need this test if:  You have heart disease.  You have had lab results that are not normal.  You have had heart surgery or a balloon procedure to open up blocked arteries (angioplasty).  You have  chest pain.  You have shortness of breath. In this test, a special dye (tracer) is put into your bloodstream. The tracer will travel to your heart. A camera will then take pictures of your heart to see how the tracer moves through your heart. This test is usually done at a hospital and takes 2-4 hours. Tell a doctor about:  Any allergies you have.  All medicines you are taking, including vitamins, herbs, eye drops, creams, and over-the-counter medicines.  Any problems you or family members have had with anesthetic medicines.  Any blood disorders you have.  Any surgeries you have had.  Any medical conditions you have.  Whether you are pregnant or may be pregnant. What are the risks? Generally, this is a safe test. However, problems may occur, such as:  Serious chest pain and heart attack. This is only a risk if the stress portion of the test is done.  Rapid heartbeat.  A feeling of warmth in your chest. This feeling usually does not last long.  Allergic reaction to the tracer. What happens before the test?  Ask your doctor about changing or stopping your normal medicines. This is important.  Follow instructions from your doctor about what you cannot eat or drink.  Remove your jewelry on the day of the test. What happens during the test? 1. An IV tube will be inserted into one of your veins. 2. Your doctor will give you a small amount of tracer through the IV tube. 3. You will wait for 20-40 minutes while the tracer moves through your bloodstream. 4. Your heart will be monitored with an electrocardiogram (ECG). 5. You will lie down on an exam table. 6. Pictures of your heart will be taken for about 15-20 minutes. 7. You may also have a stress test. For this test, one of these things may be done: ? You will be asked to exercise on a treadmill or a stationary bike. ? You will be given medicines that will make your heart work harder. This is done if you are unable to  exercise. 8. When blood flow to your heart has peaked, a tracer will again be given through the IV tube. 9. After 20-40 minutes, you will get back on the exam table. More pictures will be taken of your heart. 10. Depending on the tracer that is used, more pictures may need to be taken 3-4 hours later. 11. Your IV tube will be removed when the test is over. The test may vary among doctors and hospitals. What happens after the test? 1. Ask your doctor: ? Whether you can return to your normal schedule, including diet, activities, and medicines. ? Whether you should drink more fluids. This will help to remove the tracer from your body. Drink enough fluid to keep your pee (urine) pale yellow. 2. Ask your doctor, or the department that is doing the test: ? When will my results be ready? ? How will I get my results? Summary  A cardiac nuclear scan is  a test that is done to check the flow of blood to your heart.  Tell your doctor whether you are pregnant or may be pregnant.  Before the test, ask your doctor about changing or stopping your normal medicines. This is important.  Ask your doctor whether you can return to your normal activities. You may be asked to drink more fluids. This information is not intended to replace advice given to you by your health care provider. Make sure you discuss any questions you have with your health care provider. Document Revised: 02/25/2019 Document Reviewed: 04/21/2018 Elsevier Patient Education  2021 Elsevier Inc.    Echocardiogram An echocardiogram is a test that uses sound waves (ultrasound) to produce images of the heart. Images from an echocardiogram can provide important information about:  Heart size and shape.  The size and thickness and movement of your heart's walls.  Heart muscle function and strength.  Heart valve function or if you have stenosis. Stenosis is when the heart valves are too narrow.  If blood is flowing backward through  the heart valves (regurgitation).  A tumor or infectious growth around the heart valves.  Areas of heart muscle that are not working well because of poor blood flow or injury from a heart attack.  Aneurysm detection. An aneurysm is a weak or damaged part of an artery wall. The wall bulges out from the normal force of blood pumping through the body. Tell a health care provider about:  Any allergies you have.  All medicines you are taking, including vitamins, herbs, eye drops, creams, and over-the-counter medicines.  Any blood disorders you have.  Any surgeries you have had.  Any medical conditions you have.  Whether you are pregnant or may be pregnant. What are the risks? Generally, this is a safe test. However, problems may occur, including an allergic reaction to dye (contrast) that may be used during the test. What happens before the test? No specific preparation is needed. You may eat and drink normally. What happens during the test?  You will take off your clothes from the waist up and put on a hospital gown.  Electrodes or electrocardiogram (ECG)patches may be placed on your chest. The electrodes or patches are then connected to a device that monitors your heart rate and rhythm.  You will lie down on a table for an ultrasound exam. A gel will be applied to your chest to help sound waves pass through your skin.  A handheld device, called a transducer, will be pressed against your chest and moved over your heart. The transducer produces sound waves that travel to your heart and bounce back (or "echo" back) to the transducer. These sound waves will be captured in real-time and changed into images of your heart that can be viewed on a video monitor. The images will be recorded on a computer and reviewed by your health care provider.  You may be asked to change positions or hold your breath for a short time. This makes it easier to get different views or better views of your  heart.  In some cases, you may receive contrast through an IV in one of your veins. This can improve the quality of the pictures from your heart. The procedure may vary among health care providers and hospitals.    What can I expect after the test? You may return to your normal, everyday life, including diet, activities, and medicines, unless your health care provider tells you not to do that. Follow these  instructions at home:  It is up to you to get the results of your test. Ask your health care provider, or the department that is doing the test, when your results will be ready.  Keep all follow-up visits. This is important. Summary  An echocardiogram is a test that uses sound waves (ultrasound) to produce images of the heart.  Images from an echocardiogram can provide important information about the size and shape of your heart, heart muscle function, heart valve function, and other possible heart problems.  You do not need to do anything to prepare before this test. You may eat and drink normally.  After the echocardiogram is completed, you may return to your normal, everyday life, unless your health care provider tells you not to do that. This information is not intended to replace advice given to you by your health care provider. Make sure you discuss any questions you have with your health care provider. Document Revised: 06/28/2020 Document Reviewed: 06/28/2020 Elsevier Patient Education  2021 Elsevier Inc.  Nitroglycerin sublingual tablets What is this medicine? NITROGLYCERIN (nye troe GLI ser in) is a type of vasodilator. It relaxes blood vessels, increasing the blood and oxygen supply to your heart. This medicine is used to relieve chest pain caused by angina. It is also used to prevent chest pain before activities like climbing stairs, going outdoors in cold weather, or sexual activity. This medicine may be used for other purposes; ask your health care provider or pharmacist  if you have questions. COMMON BRAND NAME(S): Nitroquick, Nitrostat, Nitrotab What should I tell my health care provider before I take this medicine? They need to know if you have any of these conditions:  anemia  head injury, recent stroke, or bleeding in the brain  liver disease  previous heart attack  an unusual or allergic reaction to nitroglycerin, other medicines, foods, dyes, or preservatives  pregnant or trying to get pregnant  breast-feeding How should I use this medicine? Take this medicine by mouth as needed. Use at the first sign of an angina attack (chest pain or tightness). You can also take this medicine 5 to 10 minutes before an event likely to produce chest pain. Follow the directions exactly as written on the prescription label. Place one tablet under your tongue and let it dissolve. Do not swallow whole. Replace the dose if you accidentally swallow it. It will help if your mouth is not dry. Saliva around the tablet will help it to dissolve more quickly. Do not eat or drink, smoke or chew tobacco while a tablet is dissolving. Sit down when taking this medicine. In an angina attack, you should feel better within 5 minutes after your first dose. You can take a dose every 5 minutes up to a total of 3 doses. If you do not feel better or feel worse after 1 dose, call 9-1-1 at once. Do not take more than 3 doses in 15 minutes. Your health care provider might give you other directions. Follow those directions if he or she does. Do not take your medicine more often than directed. Talk to your health care provider about the use of this medicine in children. Special care may be needed. Overdosage: If you think you have taken too much of this medicine contact a poison control center or emergency room at once. NOTE: This medicine is only for you. Do not share this medicine with others. What if I miss a dose? This does not apply. This medicine is only used as  needed. What may interact  with this medicine? Do not take this medicine with any of the following medications:  certain migraine medicines like ergotamine and dihydroergotamine (DHE)  medicines used to treat erectile dysfunction like sildenafil, tadalafil, and vardenafil  riociguat This medicine may also interact with the following medications:  alteplase  aspirin  heparin  medicines for high blood pressure  medicines for mental depression  other medicines used to treat angina  phenothiazines like chlorpromazine, mesoridazine, prochlorperazine, thioridazine This list may not describe all possible interactions. Give your health care provider a list of all the medicines, herbs, non-prescription drugs, or dietary supplements you use. Also tell them if you smoke, drink alcohol, or use illegal drugs. Some items may interact with your medicine. What should I watch for while using this medicine? Tell your doctor or health care professional if you feel your medicine is no longer working. Keep this medicine with you at all times. Sit or lie down when you take your medicine to prevent falling if you feel dizzy or faint after using it. Try to remain calm. This will help you to feel better faster. If you feel dizzy, take several deep breaths and lie down with your feet propped up, or bend forward with your head resting between your knees. You may get drowsy or dizzy. Do not drive, use machinery, or do anything that needs mental alertness until you know how this drug affects you. Do not stand or sit up quickly, especially if you are an older patient. This reduces the risk of dizzy or fainting spells. Alcohol can make you more drowsy and dizzy. Avoid alcoholic drinks. Do not treat yourself for coughs, colds, or pain while you are taking this medicine without asking your doctor or health care professional for advice. Some ingredients may increase your blood pressure. What side effects may I notice from receiving this  medicine? Side effects that you should report to your doctor or health care professional as soon as possible:  allergic reactions (skin rash, itching or hives; swelling of the face, lips, or tongue)  low blood pressure (dizziness; feeling faint or lightheaded, falls; unusually weak or tired)  low red blood cell counts (trouble breathing; feeling faint; lightheaded, falls; unusually weak or tired) Side effects that usually do not require medical attention (report to your doctor or health care professional if they continue or are bothersome):  facial flushing (redness)  headache  nausea, vomiting This list may not describe all possible side effects. Call your doctor for medical advice about side effects. You may report side effects to FDA at 1-800-FDA-1088. Where should I keep my medicine? Keep out of the reach of children. Store at room temperature between 20 and 25 degrees C (68 and 77 degrees F). Store in Retail buyer. Protect from light and moisture. Keep tightly closed. Throw away any unused medicine after the expiration date. NOTE: This sheet is a summary. It may not cover all possible information. If you have questions about this medicine, talk to your doctor, pharmacist, or health care provider.  2021 Elsevier/Gold Standard (2018-08-06 16:46:32)

## 2021-04-10 ENCOUNTER — Telehealth (HOSPITAL_COMMUNITY): Payer: Self-pay | Admitting: *Deleted

## 2021-04-10 NOTE — Telephone Encounter (Signed)
Left message on voicemail per DPR in reference to upcoming appointment scheduled on 04/13/21 with detailed instructions given per Myocardial Perfusion Study Information Sheet for the test. LM to arrive 15 minutes early, and that it is imperative to arrive on time for appointment to keep from having the test rescheduled. If you need to cancel or reschedule your appointment, please call the office within 24 hours of your appointment. Failure to do so may result in a cancellation of your appointment, and a $50 no show fee. Phone number given for call back for any questions. Sim Choquette Jacqueline     

## 2021-04-13 ENCOUNTER — Other Ambulatory Visit: Payer: Self-pay

## 2021-04-13 ENCOUNTER — Ambulatory Visit (INDEPENDENT_AMBULATORY_CARE_PROVIDER_SITE_OTHER): Payer: BC Managed Care – PPO

## 2021-04-13 DIAGNOSIS — R0789 Other chest pain: Secondary | ICD-10-CM

## 2021-04-13 DIAGNOSIS — R079 Chest pain, unspecified: Secondary | ICD-10-CM | POA: Diagnosis not present

## 2021-04-13 LAB — MYOCARDIAL PERFUSION IMAGING
LV dias vol: 141 mL (ref 62–150)
LV sys vol: 72 mL
Peak HR: 93 {beats}/min
Rest HR: 59 {beats}/min
SDS: 2
SRS: 1
SSS: 3
TID: 1.05

## 2021-04-13 MED ORDER — REGADENOSON 0.4 MG/5ML IV SOLN
0.4000 mg | Freq: Once | INTRAVENOUS | Status: AC
  Administered 2021-04-13: 0.4 mg via INTRAVENOUS

## 2021-04-13 MED ORDER — TECHNETIUM TC 99M TETROFOSMIN IV KIT
10.9000 | PACK | Freq: Once | INTRAVENOUS | Status: AC | PRN
  Administered 2021-04-13: 10.9 via INTRAVENOUS

## 2021-04-13 MED ORDER — TECHNETIUM TC 99M TETROFOSMIN IV KIT
32.1000 | PACK | Freq: Once | INTRAVENOUS | Status: AC | PRN
  Administered 2021-04-13: 32.1 via INTRAVENOUS

## 2021-04-25 DIAGNOSIS — E349 Endocrine disorder, unspecified: Secondary | ICD-10-CM | POA: Insufficient documentation

## 2021-04-25 DIAGNOSIS — M5116 Intervertebral disc disorders with radiculopathy, lumbar region: Secondary | ICD-10-CM | POA: Insufficient documentation

## 2021-04-25 DIAGNOSIS — M1712 Unilateral primary osteoarthritis, left knee: Secondary | ICD-10-CM | POA: Insufficient documentation

## 2021-04-25 DIAGNOSIS — M25519 Pain in unspecified shoulder: Secondary | ICD-10-CM | POA: Insufficient documentation

## 2021-04-25 DIAGNOSIS — M549 Dorsalgia, unspecified: Secondary | ICD-10-CM | POA: Insufficient documentation

## 2021-04-25 DIAGNOSIS — F411 Generalized anxiety disorder: Secondary | ICD-10-CM | POA: Insufficient documentation

## 2021-04-25 DIAGNOSIS — E7800 Pure hypercholesterolemia, unspecified: Secondary | ICD-10-CM | POA: Insufficient documentation

## 2021-04-25 DIAGNOSIS — M7710 Lateral epicondylitis, unspecified elbow: Secondary | ICD-10-CM | POA: Insufficient documentation

## 2021-04-25 DIAGNOSIS — F32A Depression, unspecified: Secondary | ICD-10-CM | POA: Insufficient documentation

## 2021-04-25 DIAGNOSIS — F419 Anxiety disorder, unspecified: Secondary | ICD-10-CM | POA: Insufficient documentation

## 2021-04-28 ENCOUNTER — Ambulatory Visit: Payer: BC Managed Care – PPO | Admitting: Cardiology

## 2021-04-28 ENCOUNTER — Encounter: Payer: Self-pay | Admitting: Cardiology

## 2021-04-28 ENCOUNTER — Other Ambulatory Visit: Payer: Self-pay

## 2021-04-28 VITALS — BP 128/82 | HR 80 | Ht 71.0 in | Wt 183.8 lb

## 2021-04-28 DIAGNOSIS — E7800 Pure hypercholesterolemia, unspecified: Secondary | ICD-10-CM | POA: Diagnosis not present

## 2021-04-28 DIAGNOSIS — F17210 Nicotine dependence, cigarettes, uncomplicated: Secondary | ICD-10-CM | POA: Diagnosis not present

## 2021-04-28 DIAGNOSIS — R9439 Abnormal result of other cardiovascular function study: Secondary | ICD-10-CM

## 2021-04-28 DIAGNOSIS — I209 Angina pectoris, unspecified: Secondary | ICD-10-CM

## 2021-04-28 DIAGNOSIS — I10 Essential (primary) hypertension: Secondary | ICD-10-CM | POA: Diagnosis not present

## 2021-04-28 DIAGNOSIS — E782 Mixed hyperlipidemia: Secondary | ICD-10-CM

## 2021-04-28 HISTORY — DX: Abnormal result of other cardiovascular function study: R94.39

## 2021-04-28 HISTORY — DX: Angina pectoris, unspecified: I20.9

## 2021-04-28 NOTE — H&P (View-Only) (Signed)
Cardiology Office Note:    Date:  04/28/2021   ID:  Victor [name], DOB July 05, 1966, MRN [id number]  PCP:  Lonie Peak, PA-C  Cardiologist:  Garwin Brothers, MD   Referring MD: Lonie Peak, PA-C    ASSESSMENT:    1. Hypertension, benign   2. Abnormal nuclear stress test   3. Hypercholesterolemia   4. Cigarette smoker   5. Mixed dyslipidemia    PLAN:    In order of problems listed above:  Angina pectoris: Abnormal nuclear stress test: Patient symptoms are concerning and following recommendations were made to the patient.  I told him to take a coated baby aspirin on a daily basis.  Sublingual nitroglycerin prescription was sent, its protocol and 911 protocol explained and the patient vocalized understanding questions were answered to the patient's satisfaction.I discussed coronary angiography and left heart catheterization with the patient at extensive length. Procedure, benefits and potential risks were explained. Patient had multiple questions which were answered to the patient's satisfaction. Patient agreed and consented for the procedure. Further recommendations will be made based on the findings of the coronary angiography. In the interim. The patient has any significant symptoms he knows to go to the nearest emergency room. Essential hypertension: Blood pressure stable and diet was emphasized. Mixed dyslipidemia: Diet was emphasized.  Patient is on statin therapy and managed by primary care. Patient will be seen in follow-up appointment in 3 months or earlier if the patient has any concerns.    Medication Adjustments/Labs and Tests Ordered: Current medicines are reviewed at length with the patient today.  Concerns regarding medicines are outlined above.  [name] orders of the defined types were placed in this encounter.  [name] orders of the defined types were placed in this encounter.    [name] chief complaint on file.    History of Present Illness:    Victor [name] is a 55 y.o. male.  Patient has past medical history of essential hypertension, dyslipidemia and cigarette smoking.  He was evaluated for chest pain and his stress test is abnormal and reveals evidence of reversibility suggesting ischemia.  The patient mentions to me that he had an episode of chest tightness several days ago but it resolved by itself.  [name] orthopnea or PND.  He does not exert himself much because of orthopedic issues involving his back.  At the time of my evaluation, the patient is alert awake oriented and in [name] distress.  Past Medical History:  Diagnosis Date   Anxiety and depression    Back pain    Cardiac murmur 04/07/2021   Chest pain of uncertain etiology 04/07/2021   Chronic low back pain 12/03/2017   Cigarette smoker 04/07/2021   Degeneration of lumbar intervertebral disc 12/09/2017   Depression, major, recurrent, moderate (HCC) 01/18/2016   Epicondylitis, lateral    GAD (generalized anxiety disorder)    GERD (gastroesophageal reflux disease)    Hypercholesterolemia    Hypertension, benign    Lumbar disc disease with radiculopathy    Lumbar post-laminectomy syndrome 12/03/2017   Mixed dyslipidemia 04/07/2021   Opioid dependence with withdrawal (HCC) 01/18/2016   Osteoarthritis of left knee    Shoulder pain    Spinal stenosis of lumbar region 07/11/2016   Testosterone deficiency     Past Surgical History:  Procedure Laterality Date   BACK SURGERY     LUMBAR LAMINECTOMY/DECOMPRESSION MICRODISCECTOMY Right 07/11/2016   Procedure: REVISON MICRO LUMBAR DECOMPRESSION L4 - L5 ON THE RIGHT;  Surgeon: Jene Every, MD;  Location: [facility];  Service: Orthopedics;  Laterality: Right;   ROTATOR CUFF REPAIR      Current Medications: Current Meds  Medication Sig   acetaminophen (TYLENOL) 500 MG tablet Take 500 mg by mouth 2 (two) times daily as needed for moderate pain or headache.   amLODipine (NORVASC) 10 MG tablet Take 1 tablet by mouth daily.   baclofen (LIORESAL) 20 MG  tablet Take 20 mg by mouth 2 (two) times daily as needed for muscle spasms.   busPIRone (BUSPAR) 15 MG tablet Take 15 mg by mouth 3 (three) times daily as needed for anxiety (anxiety).   hydrochlorothiazide (HYDRODIURIL) 25 MG tablet Take 25 mg by mouth daily.   ibuprofen (ADVIL) 800 MG tablet Take 800 mg by mouth every 8 (eight) hours as needed for moderate pain (inflammation).   losartan (COZAAR) 100 MG tablet Take 100 mg by mouth daily. for high blood pressure   metoprolol succinate (TOPROL-XL) 50 MG 24 hr tablet Take 50 mg by mouth daily.   nitroGLYCERIN (NITROSTAT) 0.4 MG SL tablet Place 0.4 mg under the tongue every 5 (five) minutes as needed for chest pain.   omeprazole (PRILOSEC) 40 MG capsule Take 40 mg by mouth daily.   rosuvastatin (CRESTOR) 10 MG tablet Take 10 mg by mouth daily.   testosterone cypionate (DEPOTESTOSTERONE CYPIONATE) 200 MG/ML injection Inject 0.5 mLs into the muscle as needed. Every 10 days     Allergies:   Celebrex [celecoxib], Gabapentin, Nsaids, and Tylenol [acetaminophen]   Social History   Socioeconomic History   Marital status: Married    Spouse name: Not on file   Number of children: Not on file   Years of education: Not on file   Highest education level: Not on file  Occupational History   Not on file  Tobacco Use   Smoking status: Former    Packs/day: 0.20    Years: 2.00    Pack years: 0.40    Types: Cigarettes   Smokeless tobacco: Never  Substance and Sexual Activity   Alcohol use: [name]   Drug use: Yes    Comment: Opiate pills   Sexual activity: Not on file  Other Topics Concern   Not on file  Social History Narrative   Not on file   Social Determinants of Health   Financial Resource Strain: Not on file  Food Insecurity: Not on file  Transportation Needs: Not on file  Physical Activity: Not on file  Stress: Not on file  Social Connections: Not on file     Family History: The patient's family history includes Diabetes in his  mother; Hypertension in his father and mother.  ROS:   Please see the history of present illness.    All other systems reviewed and are negative.  EKGs/Labs/Other Studies Reviewed:    The following studies were reviewed today: Study Highlights  Addendum by Garwin Brothers, MD on Thu Apr 13, 2021  6:10 PM Nuclear stress EF: 49%. The left ventricular ejection fraction is mildly decreased (45-54%). There was [name] ST segment deviation noted during stress. Defect 1: There is a medium defect of moderate severity present in the basal inferior and mid inferior location. Findings consistent with ischemia.     Recent Labs: [name] results found for requested labs within last 8760 hours.  Recent Lipid Panel [name] results found for: CHOL, TRIG, HDL, CHOLHDL, VLDL, LDLCALC, LDLDIRECT  Physical Exam:    VS:  BP 128/82   Pulse 80   Ht 5\' 11"  (  1.803 m)   Wt 183 lb 12.8 oz (83.4 kg)   SpO2 95%   BMI 25.63 kg/m     Wt Readings from Last 3 Encounters:  04/28/21 183 lb 12.8 oz (83.4 kg)  04/13/21 178 lb (80.7 kg)  04/07/21 178 lb 3.2 oz (80.8 kg)     GEN: Patient is in [name] acute distress HEENT: Normal NECK: [name] JVD; [name] carotid bruits LYMPHATICS: [name] lymphadenopathy CARDIAC: Hear sounds regular, 2/6 systolic murmur at the apex. RESPIRATORY:  Clear to auscultation without rales, wheezing or rhonchi  ABDOMEN: Soft, non-tender, non-distended MUSCULOSKELETAL:  [name] edema; [name] deformity  SKIN: Warm and dry NEUROLOGIC:  Alert and oriented x 3 PSYCHIATRIC:  Normal affect   Signed, Garwin Brothers, MD  04/28/2021 2:31 PM    [facility]

## 2021-04-28 NOTE — Patient Instructions (Signed)
Medication Instructions:  Your physician has recommended you make the following change in your medication:   Take 81 mg coated aspirin daily. Use nitroglycerin as needed for chest pain.  *If you need a refill on your cardiac medications before your next appointment, please call your pharmacy*   Lab Work: Your physician recommends that you have a BMET and CBC today in the office for your upcoming procedure.  If you have labs (blood work) drawn today and your tests are completely normal, you will receive your results only by: MyChart Message (if you have MyChart) OR A paper copy in the mail If you have any lab test that is abnormal or we need to change your treatment, we will call you to review the results.   Testing/Procedures:    [facility]ARD[facility]ASCULAR [facility] AT [place] [address] [place] [address] Dept: [phone] Loc: [phone]  Kvon Mcilhenny  04/28/2021  You are scheduled for a Cardiac Catheterization on Tuesday, June 14 with Dr. Nicki Guadalajara.  1. Please arrive at the [facility] (Main Entrance A) at [facility]: [address] [place], [place] [address] at 5:30 AM (This time is two hours before your procedure to ensure your preparation). Free valet parking service is available.   Special note: Every effort is made to have your procedure done on time. Please understand that emergencies sometimes delay scheduled procedures.  2. Diet: Do not eat solid foods after midnight.  The patient may have clear liquids until 5am upon the day of the procedure.  3. Labs: You had your labs done in the office. 4. Medication instructions in preparation for your procedure:   Contrast Allergy: No  Stop taking, Cozaar (Losartan) Tuesday, June 14,, HTCZ (Hydrochlorothiazide) Tuesday, June 14,   On the morning of your procedure, take your Aspirin and any morning medicines NOT listed above.  You may use sips of  water.  5. Plan for one night stay--bring personal belongings. 6. Bring a current list of your medications and current insurance cards. 7. You MUST have a responsible person to drive you home. 8. Someone MUST be with you the first 24 hours after you arrive home or your discharge will be delayed. 9. Please wear clothes that are easy to get on and off and wear slip-on shoes.  Thank you for allowing [place] to care for you!   --  Invasive Cardiovascular services    Follow-Up: At [facility], you and your health needs are our priority.  As part of our continuing mission to provide you with exceptional heart care, we have created designated Provider Care Teams.  These Care Teams include your primary Cardiologist (physician) and Advanced Practice Providers (APPs -  Physician Assistants and Nurse Practitioners) who all work together to provide you with the care you need, when you need it.  We recommend signing up for the patient portal called "MyChart".  Sign up information is provided on this After Visit Summary.  MyChart is used to connect with patients for Virtual Visits (Telemedicine).  Patients are able to view lab/test results, encounter notes, upcoming appointments, etc.  Non-urgent messages can be sent to your provider as well.   To learn more about what you can do with MyChart, go to [email or web address].    Your next appointment:   6 month(s)  The format for your next appointment:   In Person  Provider:   Belva Crome, MD   Other Instructions  Coronary Angiogram  With Stent Coronary angiogram with stent placement is a procedure to widen or open a narrow blood vessel of the heart (coronary artery). Arteries may become blocked by cholesterol buildup (plaques) in the lining of the artery wall. When a coronary artery becomes partially blocked, blood flow to that area decreases. This may lead to chest pain or a heart attack (myocardial infarction). A stent is a small piece  of metal that looks like mesh or spring. Stent placement may be done as treatment after a heart attack, or to prevent a heart attack if a blocked artery is found by a coronary angiogram. Let your health care provider know about: Any allergies you have, including allergies to medicines or contrast dye. All medicines you are taking, including vitamins, herbs, eye drops, creams, and over-the-counter medicines. Any problems you or family members have had with anesthetic medicines. Any blood disorders you have. Any surgeries you have had. Any medical conditions you have, including kidney problems or kidney failure. Whether you are pregnant or may be pregnant. Whether you are breastfeeding. What are the risks? Generally, this is a safe procedure. However, serious problems may occur, including: Damage to nearby structures or organs, such as the heart, blood vessels, or kidneys. A return of blockage. Bleeding, infection, or bruising at the insertion site. A collection of blood under the skin (hematoma) at the insertion site. A blood clot in another part of the body. Allergic reaction to medicines or dyes. Bleeding into the abdomen (retroperitoneal bleeding). Stroke (rare). Heart attack (rare). What happens before the procedure? Staying hydrated Follow instructions from your health care provider about hydration, which may include: Up to 2 hours before the procedure - you may continue to drink clear liquids, such as water, clear fruit juice, black coffee, and plain tea.    Eating and drinking restrictions Follow instructions from your health care provider about eating and drinking, which may include: 8 hours before the procedure - stop eating heavy meals or foods, such as meat, fried foods, or fatty foods. 6 hours before the procedure - stop eating light meals or foods, such as toast or cereal. 2 hours before the procedure - stop drinking clear liquids. Medicines Ask your health care provider  about: Changing or stopping your regular medicines. This is especially important if you are taking diabetes medicines or blood thinners. Taking medicines such as aspirin and ibuprofen. These medicines can thin your blood. Do not take these medicines unless your health care provider tells you to take them. Generally, aspirin is recommended before a thin tube, called a catheter, is passed through a blood vessel and inserted into the heart (cardiac catheterization). Taking over-the-counter medicines, vitamins, herbs, and supplements. General instructions Do not use any products that contain nicotine or tobacco for at least 4 weeks before the procedure. These products include cigarettes, e-cigarettes, and chewing tobacco. If you need help quitting, ask your health care provider. Plan to have someone take you home from the hospital or clinic. If you will be going home right after the procedure, plan to have someone with you for 24 hours. You may have tests and imaging procedures. Ask your health care provider: How your insertion site will be marked. Ask which artery will be used for the procedure. What steps will be taken to help prevent infection. These may include: Removing hair at the insertion site. Washing skin with a germ-killing soap. Taking antibiotic medicine. What happens during the procedure? An IV will be inserted into one of your veins. Electrodes  may be placed on your chest to monitor your heart rate during the procedure. You will be given one or more of the following: A medicine to help you relax (sedative). A medicine to numb the area (local anesthetic) for catheter insertion. A small incision will be made for catheter insertion. The catheter will be inserted into an artery using a guide wire. The location may be in your groin, your wrist, or the fold of your arm (near your elbow). An X-ray procedure (fluoroscopy) will be used to help guide the catheter to the opening of the heart  arteries. A dye will be injected into the catheter. X-rays will be taken. The dye helps to show where any narrowing or blockages are located in the arteries. Tell your health care provider if you have chest pain or trouble breathing. A tiny wire will be guided to the blocked spot, and a balloon will be inflated to make the artery wider. The stent will be expanded to crush the plaques into the wall of the vessel. The stent will hold the area open and improve the blood flow. Most stents have a drug coating to reduce the risk of the stent narrowing over time. The artery may be made wider using a drill, laser, or other tools that remove plaques. The catheter will be removed when the blood flow improves. The stent will stay where it was placed, and the lining of the artery will grow over it. A bandage (dressing) will be placed on the insertion site. Pressure will be applied to stop bleeding. The IV will be removed. This procedure may vary among health care providers and hospitals.    What happens after the procedure? Your blood pressure, heart rate, breathing rate, and blood oxygen level will be monitored until you leave the hospital or clinic. If the procedure is done through the leg, you will lie flat in bed for a few hours or for as long as told by your health care provider. You will be instructed not to bend or cross your legs. The insertion site and the pulse in your foot or wrist will be checked often. You may have more blood tests, X-rays, and a test that records the electrical activity of your heart (electrocardiogram, or ECG). Do not drive for 24 hours if you were given a sedative during your procedure. Summary Coronary angiogram with stent placement is a procedure to widen or open a narrowed coronary artery. This is done to treat heart problems. Before the procedure, let your health care provider know about all the medical conditions and surgeries you have or have had. This is a safe  procedure. However, some problems may occur, including damage to nearby structures or organs, bleeding, blood clots, or allergies. Follow your health care provider's instructions about eating, drinking, medicines, and other lifestyle changes, such as quitting tobacco use before the procedure. This information is not intended to replace advice given to you by your health care provider. Make sure you discuss any questions you have with your health care provider. Document Revised: 05/27/2019 Document Reviewed: 05/27/2019 Elsevier Patient Education  2021 Elsevier Inc.  Aspirin and Your Heart Aspirin is a medicine that prevents the platelets in your blood from sticking together. Platelets are the cells that your blood uses for clotting. Aspirin can be used to help reduce the risk of blood clots, heart attacks, and other heart-related problems. What are the risks? Daily use of aspirin can cause side effects. Some of these include: Bleeding. Bleeding can be  minor or serious. An example of minor bleeding is bleeding from a cut, and the bleeding does not stop. An example of more serious bleeding is stomach bleeding or, rarely, bleeding into the brain. Your risk of bleeding increases if you are also taking NSAIDs, such as ibuprofen. Increased bruising. Upset stomach. An allergic reaction. People who have growths inside the nose (nasal polyps) have an increased risk of developing an aspirin allergy. How to use aspirin to care for your heart Take aspirin only as told by your health care provider. Make sure that you understand how much to take and what form to take. The two forms of aspirin are: Non-enteric-coated.This type of aspirin does not have a coating and is absorbed quickly. This type of aspirin also comes in a chewable form. Enteric-coated. This type of aspirin has a coating that releases the medicine very slowly. Enteric-coated aspirin might cause less stomach upset than non-enteric-coated aspirin.  This type of aspirin should not be chewed or crushed. Work with your health care provider to find out whether it is safe and beneficial for you to take aspirin daily. Taking aspirin daily may be helpful if: You have had a heart attack or chest pain, or you are at risk for a heart attack. You have a condition in which certain heart vessels are blocked (coronary artery disease), and you have had a procedure to treat it. Examples are: Open-heart surgery, such as coronary artery bypass surgery (CABG). Coronary angioplasty,which is done to widen a blood vessel of your heart. Having a small mesh tube, or stent, placed in your coronary artery. You have had certain types of stroke or a mini-stroke known as a transient ischemic attack (TIA). You have a narrowing of the arteries that supply the limbs (peripheral artery disease, or PAD). You have long-term (chronic) heart rhythm problems, such as atrial fibrillation, and your health care provider thinks aspirin may help. You have valve disease or have had surgery on a valve. You are considered at increased risk of developing coronary artery disease or PAD.    Follow these instructions at home Medicines Take over-the-counter and prescription medicines only as told by your health care provider. If you are taking blood thinners: Talk with your health care provider before you take any medicines that contain aspirin or NSAIDs, such as ibuprofen. These medicines increase your risk for dangerous bleeding. Take your medicine exactly as told, at the same time every day. Avoid activities that could cause injury or bruising, and follow instructions about how to prevent falls. Wear a medical alert bracelet or carry a card that lists what medicines you take. General instructions Do not drink alcohol if: Your health care provider tells you not to drink. You are pregnant, may be pregnant, or are planning to become pregnant. If you drink alcohol: Limit how much you  use to: 0-1 drink a day for women. 0-2 drinks a day for men. Be aware of how much alcohol is in your drink. In the [place] one drink equals one 12 oz bottle of beer (355 mL), one 5 oz glass of wine (148 mL), or one 1 oz glass of hard liquor (44 mL). Keep all follow-up visits as told by your health care provider. This is important. Where to find more information The American Heart Association: www.heart.org Contact a health care provider if you have: Unusual bleeding or bruising. Stomach pain or nausea. Ringing in your ears. An allergic reaction that causes hives, itchy skin, or swelling of the lips, tongue,  or face. Get help right away if: You notice that your bowel movements are bloody, or dark red or black in color. You vomit or cough up blood. You have blood in your urine. You cough, breathe loudly (wheeze), or feel short of breath. You have chest pain, especially if the pain spreads to your arms, back, neck, or jaw. You have a headache with confusion. You have any symptoms of a stroke. "BE FAST" is an easy way to remember the main warning signs of a stroke: B - Balance. Signs are dizziness, sudden trouble walking, or loss of balance. E - Eyes. Signs are trouble seeing or a sudden change in vision. F - Face. Signs are sudden weakness or numbness of the face, or the face or eyelid drooping on one side. A - Arms. Signs are weakness or numbness in an arm. This happens suddenly and usually on one side of the body. S - Speech. Signs are sudden trouble speaking, slurred speech, or trouble understanding what people say. T - Time. Time to call emergency services. Write down what time symptoms started. You have other signs of a stroke, such as: A sudden, severe headache with no known cause. Nausea or vomiting. Seizure. These symptoms may represent a serious problem that is an emergency. Do not wait to see if the symptoms will go away. Get medical help right away. Call your local emergency  services (911 in the [place] Do not drive yourself to the hospital. Summary Aspirin use can help reduce the risk of blood clots, heart attacks, and other heart-related problems. Daily use of aspirin can cause side effects. Take aspirin only as told by your health care provider. Make sure that you understand how much to take and what form to take. Your health care provider will help you determine whether it is safe and beneficial for you to take aspirin daily. This information is not intended to replace advice given to you by your health care provider. Make sure you discuss any questions you have with your health care provider. Document Revised: 08/10/2019 Document Reviewed: 08/10/2019 Elsevier Patient Education  2021 Elsevier Inc. Nitroglycerin sublingual tablets What is this medicine? NITROGLYCERIN (nye troe GLI ser in) is a type of vasodilator. It relaxes blood vessels, increasing the blood and oxygen supply to your heart. This medicine is used to relieve chest pain caused by angina. It is also used to prevent chest pain before activities like climbing stairs, going outdoors in cold weather, or sexual activity. This medicine may be used for other purposes; ask your health care provider or pharmacist if you have questions. COMMON BRAND NAME(S): Nitroquick, Nitrostat, Nitrotab What should I tell my health care provider before I take this medicine? They need to know if you have any of these conditions: anemia head injury, recent stroke, or bleeding in the brain liver disease previous heart attack an unusual or allergic reaction to nitroglycerin, other medicines, foods, dyes, or preservatives pregnant or trying to get pregnant breast-feeding How should I use this medicine? Take this medicine by mouth as needed. Use at the first sign of an angina attack (chest pain or tightness). You can also take this medicine 5 to 10 minutes before an event likely to produce chest pain. Follow the directions  exactly as written on the prescription label. Place one tablet under your tongue and let it dissolve. Do not swallow whole. Replace the dose if you accidentally swallow it. It will help if your mouth is not dry. Saliva around the tablet  will help it to dissolve more quickly. Do not eat or drink, smoke or chew tobacco while a tablet is dissolving. Sit down when taking this medicine. In an angina attack, you should feel better within 5 minutes after your first dose. You can take a dose every 5 minutes up to a total of 3 doses. If you do not feel better or feel worse after 1 dose, call 9-1-1 at once. Do not take more than 3 doses in 15 minutes. Your health care provider might give you other directions. Follow those directions if he or she does. Do not take your medicine more often than directed. Talk to your health care provider about the use of this medicine in children. Special care may be needed. Overdosage: If you think you have taken too much of this medicine contact a poison control center or emergency room at once. NOTE: This medicine is only for you. Do not share this medicine with others. What if I miss a dose? This does not apply. This medicine is only used as needed. What may interact with this medicine? Do not take this medicine with any of the following medications: certain migraine medicines like ergotamine and dihydroergotamine (DHE) medicines used to treat erectile dysfunction like sildenafil, tadalafil, and vardenafil riociguat This medicine may also interact with the following medications: alteplase aspirin heparin medicines for high blood pressure medicines for mental depression other medicines used to treat angina phenothiazines like chlorpromazine, mesoridazine, prochlorperazine, thioridazine This list may not describe all possible interactions. Give your health care provider a list of all the medicines, herbs, non-prescription drugs, or dietary supplements you use. Also tell  them if you smoke, drink alcohol, or use illegal drugs. Some items may interact with your medicine. What should I watch for while using this medicine? Tell your doctor or health care professional if you feel your medicine is no longer working. Keep this medicine with you at all times. Sit or lie down when you take your medicine to prevent falling if you feel dizzy or faint after using it. Try to remain calm. This will help you to feel better faster. If you feel dizzy, take several deep breaths and lie down with your feet propped up, or bend forward with your head resting between your knees. You may get drowsy or dizzy. Do not drive, use machinery, or do anything that needs mental alertness until you know how this drug affects you. Do not stand or sit up quickly, especially if you are an older patient. This reduces the risk of dizzy or fainting spells. Alcohol can make you more drowsy and dizzy. Avoid alcoholic drinks. Do not treat yourself for coughs, colds, or pain while you are taking this medicine without asking your doctor or health care professional for advice. Some ingredients may increase your blood pressure. What side effects may I notice from receiving this medicine? Side effects that you should report to your doctor or health care professional as soon as possible: allergic reactions (skin rash, itching or hives; swelling of the face, lips, or tongue) low blood pressure (dizziness; feeling faint or lightheaded, falls; unusually weak or tired) low red blood cell counts (trouble breathing; feeling faint; lightheaded, falls; unusually weak or tired) Side effects that usually do not require medical attention (report to your doctor or health care professional if they continue or are bothersome): facial flushing (redness) headache nausea, vomiting This list may not describe all possible side effects. Call your doctor for medical advice about side effects. You may  report side effects to FDA at  1-800-FDA-1088. Where should I keep my medicine? Keep out of the reach of children. Store at room temperature between 20 and 25 degrees C (68 and 77 degrees F). Store in Retail buyeroriginal container. Protect from light and moisture. Keep tightly closed. Throw away any unused medicine after the expiration date. NOTE: This sheet is a summary. It may not cover all possible information. If you have questions about this medicine, talk to your doctor, pharmacist, or health care provider.  2021 Elsevier/Gold Standard (2018-08-06 16:46:32)

## 2021-04-28 NOTE — Progress Notes (Signed)
Cardiology Office Note:    Date:  04/28/2021   ID:  Victor [name], DOB July 05, 1966, MRN [id number]  PCP:  Victor Peak, PA-C  Cardiologist:  Victor Brothers, MD   Referring MD: Victor Peak, PA-C    ASSESSMENT:    1. Hypertension, benign   2. Abnormal nuclear stress test   3. Hypercholesterolemia   4. Cigarette smoker   5. Mixed dyslipidemia    PLAN:    In order of problems listed above:  Angina pectoris: Abnormal nuclear stress test: Patient symptoms are concerning and following recommendations were made to the patient.  I told him to take a coated baby aspirin on a daily basis.  Sublingual nitroglycerin prescription was sent, its protocol and 911 protocol explained and the patient vocalized understanding questions were answered to the patient's satisfaction.I discussed coronary angiography and left heart catheterization with the patient at extensive length. Procedure, benefits and potential risks were explained. Patient had multiple questions which were answered to the patient's satisfaction. Patient agreed and consented for the procedure. Further recommendations will be made based on the findings of the coronary angiography. In the interim. The patient has any significant symptoms he knows to go to the nearest emergency room. Essential hypertension: Blood pressure stable and diet was emphasized. Mixed dyslipidemia: Diet was emphasized.  Patient is on statin therapy and managed by primary care. Patient will be seen in follow-up appointment in 3 months or earlier if the patient has any concerns.    Medication Adjustments/Labs and Tests Ordered: Current medicines are reviewed at length with the patient today.  Concerns regarding medicines are outlined above.  [name] orders of the defined types were placed in this encounter.  [name] orders of the defined types were placed in this encounter.    [name] chief complaint on file.    History of Present Illness:    Victor [name] is a 55 y.o. male.  Patient has past medical history of essential hypertension, dyslipidemia and cigarette smoking.  He was evaluated for chest pain and his stress test is abnormal and reveals evidence of reversibility suggesting ischemia.  The patient mentions to me that he had an episode of chest tightness several days ago but it resolved by itself.  [name] orthopnea or PND.  He does not exert himself much because of orthopedic issues involving his back.  At the time of my evaluation, the patient is alert awake oriented and in [name] distress.  Past Medical History:  Diagnosis Date   Anxiety and depression    Back pain    Cardiac murmur 04/07/2021   Chest pain of uncertain etiology 04/07/2021   Chronic low back pain 12/03/2017   Cigarette smoker 04/07/2021   Degeneration of lumbar intervertebral disc 12/09/2017   Depression, major, recurrent, moderate (HCC) 01/18/2016   Epicondylitis, lateral    GAD (generalized anxiety disorder)    GERD (gastroesophageal reflux disease)    Hypercholesterolemia    Hypertension, benign    Lumbar disc disease with radiculopathy    Lumbar post-laminectomy syndrome 12/03/2017   Mixed dyslipidemia 04/07/2021   Opioid dependence with withdrawal (HCC) 01/18/2016   Osteoarthritis of left knee    Shoulder pain    Spinal stenosis of lumbar region 07/11/2016   Testosterone deficiency     Past Surgical History:  Procedure Laterality Date   BACK SURGERY     LUMBAR LAMINECTOMY/DECOMPRESSION MICRODISCECTOMY Right 07/11/2016   Procedure: REVISON MICRO LUMBAR DECOMPRESSION L4 - L5 ON THE RIGHT;  Surgeon: Jene Every, MD;  Location: [facility];  Service: Orthopedics;  Laterality: Right;   ROTATOR CUFF REPAIR      Current Medications: Current Meds  Medication Sig   acetaminophen (TYLENOL) 500 MG tablet Take 500 mg by mouth 2 (two) times daily as needed for moderate pain or headache.   amLODipine (NORVASC) 10 MG tablet Take 1 tablet by mouth daily.   baclofen (LIORESAL) 20 MG  tablet Take 20 mg by mouth 2 (two) times daily as needed for muscle spasms.   busPIRone (BUSPAR) 15 MG tablet Take 15 mg by mouth 3 (three) times daily as needed for anxiety (anxiety).   hydrochlorothiazide (HYDRODIURIL) 25 MG tablet Take 25 mg by mouth daily.   ibuprofen (ADVIL) 800 MG tablet Take 800 mg by mouth every 8 (eight) hours as needed for moderate pain (inflammation).   losartan (COZAAR) 100 MG tablet Take 100 mg by mouth daily. for high blood pressure   metoprolol succinate (TOPROL-XL) 50 MG 24 hr tablet Take 50 mg by mouth daily.   nitroGLYCERIN (NITROSTAT) 0.4 MG SL tablet Place 0.4 mg under the tongue every 5 (five) minutes as needed for chest pain.   omeprazole (PRILOSEC) 40 MG capsule Take 40 mg by mouth daily.   rosuvastatin (CRESTOR) 10 MG tablet Take 10 mg by mouth daily.   testosterone cypionate (DEPOTESTOSTERONE CYPIONATE) 200 MG/ML injection Inject 0.5 mLs into the muscle as needed. Every 10 days     Allergies:   Celebrex [celecoxib], Gabapentin, Nsaids, and Tylenol [acetaminophen]   Social History   Socioeconomic History   Marital status: Married    Spouse name: Not on file   Number of children: Not on file   Years of education: Not on file   Highest education level: Not on file  Occupational History   Not on file  Tobacco Use   Smoking status: Former    Packs/day: 0.20    Years: 2.00    Pack years: 0.40    Types: Cigarettes   Smokeless tobacco: Never  Substance and Sexual Activity   Alcohol use: [name]   Drug use: Yes    Comment: Opiate pills   Sexual activity: Not on file  Other Topics Concern   Not on file  Social History Narrative   Not on file   Social Determinants of Health   Financial Resource Strain: Not on file  Food Insecurity: Not on file  Transportation Needs: Not on file  Physical Activity: Not on file  Stress: Not on file  Social Connections: Not on file     Family History: The patient's family history includes Diabetes in his  mother; Hypertension in his father and mother.  ROS:   Please see the history of present illness.    All other systems reviewed and are negative.  EKGs/Labs/Other Studies Reviewed:    The following studies were reviewed today: Study Highlights  Addendum by Victor Brothers, MD on Thu Apr 13, 2021  6:10 PM Nuclear stress EF: 49%. The left ventricular ejection fraction is mildly decreased (45-54%). There was [name] ST segment deviation noted during stress. Defect 1: There is a medium defect of moderate severity present in the basal inferior and mid inferior location. Findings consistent with ischemia.     Recent Labs: [name] results found for requested labs within last 8760 hours.  Recent Lipid Panel [name] results found for: CHOL, TRIG, HDL, CHOLHDL, VLDL, LDLCALC, LDLDIRECT  Physical Exam:    VS:  BP 128/82   Pulse 80   Ht 5\' 11"  (  1.803 m)   Wt 183 lb 12.8 oz (83.4 kg)   SpO2 95%   BMI 25.63 kg/m     Wt Readings from Last 3 Encounters:  04/28/21 183 lb 12.8 oz (83.4 kg)  04/13/21 178 lb (80.7 kg)  04/07/21 178 lb 3.2 oz (80.8 kg)     GEN: Patient is in [name] acute distress HEENT: Normal NECK: [name] JVD; [name] carotid bruits LYMPHATICS: [name] lymphadenopathy CARDIAC: Hear sounds regular, 2/6 systolic murmur at the apex. RESPIRATORY:  Clear to auscultation without rales, wheezing or rhonchi  ABDOMEN: Soft, non-tender, non-distended MUSCULOSKELETAL:  [name] edema; [name] deformity  SKIN: Warm and dry NEUROLOGIC:  Alert and oriented x 3 PSYCHIATRIC:  Normal affect   Signed, Victor Brothers, MD  04/28/2021 2:31 PM    [facility]

## 2021-04-29 LAB — CBC WITH DIFFERENTIAL/PLATELET
Basophils Absolute: 0 10*3/uL (ref 0.0–0.2)
Basos: 0 %
EOS (ABSOLUTE): 0.1 10*3/uL (ref 0.0–0.4)
Eos: 2 %
Hematocrit: 45.3 % (ref 37.5–51.0)
Hemoglobin: 15.7 g/dL (ref 13.0–17.7)
Immature Grans (Abs): 0 10*3/uL (ref 0.0–0.1)
Immature Granulocytes: 1 %
Lymphocytes Absolute: 2.3 10*3/uL (ref 0.7–3.1)
Lymphs: 39 %
MCH: 30 pg (ref 26.6–33.0)
MCHC: 34.7 g/dL (ref 31.5–35.7)
MCV: 87 fL (ref 79–97)
Monocytes Absolute: 0.5 10*3/uL (ref 0.1–0.9)
Monocytes: 9 %
Neutrophils Absolute: 2.9 10*3/uL (ref 1.4–7.0)
Neutrophils: 49 %
Platelets: 226 10*3/uL (ref 150–450)
RBC: 5.24 x10E6/uL (ref 4.14–5.80)
RDW: 13.1 % (ref 11.6–15.4)
WBC: 5.8 10*3/uL (ref 3.4–10.8)

## 2021-04-29 LAB — BASIC METABOLIC PANEL
BUN/Creatinine Ratio: 13 (ref 9–20)
BUN: 14 mg/dL (ref 6–24)
CO2: 24 mmol/L (ref 20–29)
Calcium: 9.3 mg/dL (ref 8.7–10.2)
Chloride: 98 mmol/L (ref 96–106)
Creatinine, Ser: 1.04 mg/dL (ref 0.76–1.27)
Glucose: 108 mg/dL — ABNORMAL HIGH (ref 65–99)
Potassium: 4.4 mmol/L (ref 3.5–5.2)
Sodium: 139 mmol/L (ref 134–144)
eGFR: 85 mL/min/{1.73_m2} (ref >59)

## 2021-05-01 ENCOUNTER — Telehealth: Payer: Self-pay | Admitting: *Deleted

## 2021-05-01 NOTE — Telephone Encounter (Signed)
Reviewed procedure/mask/visitor instructions with patient. 

## 2021-05-01 NOTE — Telephone Encounter (Signed)
Voicemail message, no answer. 

## 2021-05-01 NOTE — Telephone Encounter (Addendum)
Pt contacted pre-catheterization scheduled at [facility] for: Tuesday May 02, 2021 7:30 AM Verified arrival time and place: [facility] Main Entrance A [facility]) at: 5:30 AM   No solid food after midnight prior to cath, clear liquids until 5 AM day of procedure.  Hold: HCTZ-AM of procedure  Except hold medications AM meds can be  taken pre-cath with sips of water including: ASA 81 mg   Confirmed patient has responsible adult to drive home post procedure and be with patient first 24 hours after arriving home: yes  You are allowed ONE visitor in the waiting room during the time you are at the hospital for your procedure. Both you and your visitor must wear a mask once you enter the hospital.   Patient reports does not currently have any symptoms concerning for COVID-19 and no household members with COVID-19 like illness.       LMTCB to review procedure instructions with patient.

## 2021-05-02 ENCOUNTER — Ambulatory Visit (HOSPITAL_COMMUNITY)
Admission: RE | Disposition: A | Discharge status: Home / Self Care | Payer: Self-pay | Source: Home / Self Care | Attending: Cardiovascular Disease

## 2021-05-02 ENCOUNTER — Other Ambulatory Visit: Payer: BC Managed Care – PPO

## 2021-05-02 ENCOUNTER — Encounter (HOSPITAL_COMMUNITY): Payer: Self-pay | Admitting: Cardiovascular Disease

## 2021-05-02 ENCOUNTER — Ambulatory Visit (HOSPITAL_COMMUNITY)
Admission: RE | Admit: 2021-05-02 | Discharge: 2021-05-02 | Disposition: A | Discharge status: Home / Self Care | Payer: BC Managed Care – PPO | Attending: Cardiovascular Disease | Admitting: Cardiovascular Disease

## 2021-05-02 DIAGNOSIS — E782 Mixed hyperlipidemia: Secondary | ICD-10-CM | POA: Diagnosis not present

## 2021-05-02 DIAGNOSIS — E7800 Pure hypercholesterolemia, unspecified: Secondary | ICD-10-CM

## 2021-05-02 DIAGNOSIS — Z79899 Other long term (current) drug therapy: Secondary | ICD-10-CM | POA: Insufficient documentation

## 2021-05-02 DIAGNOSIS — Z886 Allergy status to analgesic agent status: Secondary | ICD-10-CM | POA: Insufficient documentation

## 2021-05-02 DIAGNOSIS — I10 Essential (primary) hypertension: Secondary | ICD-10-CM

## 2021-05-02 DIAGNOSIS — F17210 Nicotine dependence, cigarettes, uncomplicated: Secondary | ICD-10-CM | POA: Diagnosis not present

## 2021-05-02 DIAGNOSIS — Z888 Allergy status to other drugs, medicaments and biological substances status: Secondary | ICD-10-CM | POA: Insufficient documentation

## 2021-05-02 DIAGNOSIS — I209 Angina pectoris, unspecified: Secondary | ICD-10-CM | POA: Diagnosis not present

## 2021-05-02 DIAGNOSIS — R9439 Abnormal result of other cardiovascular function study: Secondary | ICD-10-CM | POA: Diagnosis not present

## 2021-05-02 HISTORY — PX: LEFT HEART CATH AND CORONARY ANGIOGRAPHY: CATH118249

## 2021-05-02 SURGERY — LEFT HEART CATH AND CORONARY ANGIOGRAPHY
Anesthesia: LOCAL

## 2021-05-02 MED ORDER — VERAPAMIL HCL 2.5 MG/ML IV SOLN
INTRAVENOUS | Status: DC | PRN
  Administered 2021-05-02: 10 mL via INTRA_ARTERIAL

## 2021-05-02 MED ORDER — FENTANYL CITRATE (PF) 100 MCG/2ML IJ SOLN
INTRAMUSCULAR | Status: AC
  Filled 2021-05-02: qty 2

## 2021-05-02 MED ORDER — VERAPAMIL HCL 2.5 MG/ML IV SOLN
INTRAVENOUS | Status: AC
  Filled 2021-05-02: qty 2

## 2021-05-02 MED ORDER — ASPIRIN 81 MG PO CHEW
81.0000 mg | CHEWABLE_TABLET | ORAL | Status: AC
  Administered 2021-05-02: 81 mg via ORAL
  Filled 2021-05-02: qty 1

## 2021-05-02 MED ORDER — MIDAZOLAM HCL 2 MG/2ML IJ SOLN
INTRAMUSCULAR | Status: AC
  Filled 2021-05-02: qty 2

## 2021-05-02 MED ORDER — SODIUM CHLORIDE 0.9% FLUSH
3.0000 mL | INTRAVENOUS | Status: DC | PRN
Start: 2021-05-02 — End: 2021-05-02

## 2021-05-02 MED ORDER — IOHEXOL 350 MG/ML SOLN
INTRAVENOUS | Status: DC | PRN
  Administered 2021-05-02: 50 mL via INTRA_ARTERIAL

## 2021-05-02 MED ORDER — SODIUM CHLORIDE 0.9 % WEIGHT BASED INFUSION
3.0000 mL/kg/h | INTRAVENOUS | Status: AC
  Administered 2021-05-02: 3 mL/kg/h via INTRAVENOUS

## 2021-05-02 MED ORDER — HEPARIN SODIUM (PORCINE) 1000 UNIT/ML IJ SOLN
INTRAMUSCULAR | Status: AC
  Filled 2021-05-02: qty 1

## 2021-05-02 MED ORDER — HEPARIN (PORCINE) IN NACL 1000-0.9 UT/500ML-% IV SOLN
INTRAVENOUS | Status: DC | PRN
  Administered 2021-05-02: 500 mL

## 2021-05-02 MED ORDER — FENTANYL CITRATE (PF) 100 MCG/2ML IJ SOLN
INTRAMUSCULAR | Status: DC | PRN
  Administered 2021-05-02: 50 ug via INTRAVENOUS

## 2021-05-02 MED ORDER — LIDOCAINE HCL (PF) 1 % IJ SOLN
INTRAMUSCULAR | Status: AC
  Filled 2021-05-02: qty 30

## 2021-05-02 MED ORDER — SODIUM CHLORIDE 0.9% FLUSH: 3.0000 mL | Freq: Two times a day (BID) | INTRAVENOUS | Status: DC

## 2021-05-02 MED ORDER — LIDOCAINE HCL (PF) 1 % IJ SOLN
INTRAMUSCULAR | Status: DC | PRN
  Administered 2021-05-02: 2 mL via INTRADERMAL

## 2021-05-02 MED ORDER — SODIUM CHLORIDE 0.9 % WEIGHT BASED INFUSION
1.0000 mL/kg/h | INTRAVENOUS | Status: DC
Start: 2021-05-02 — End: 2021-05-02

## 2021-05-02 MED ORDER — MIDAZOLAM HCL 2 MG/2ML IJ SOLN
INTRAMUSCULAR | Status: DC | PRN
  Administered 2021-05-02: 2 mg via INTRAVENOUS

## 2021-05-02 MED ORDER — SODIUM CHLORIDE 0.9 % IV SOLN: 250.0000 mL | INTRAVENOUS | Status: DC | PRN

## 2021-05-02 MED ORDER — HEPARIN SODIUM (PORCINE) 1000 UNIT/ML IJ SOLN
INTRAMUSCULAR | Status: DC | PRN
  Administered 2021-05-02: 4500 [IU] via INTRAVENOUS

## 2021-05-02 MED ORDER — HEPARIN (PORCINE) IN NACL 1000-0.9 UT/500ML-% IV SOLN
INTRAVENOUS | Status: AC
  Filled 2021-05-02: qty 1000

## 2021-05-02 SURGICAL SUPPLY — 11 items
CATH INFINITI JR4 5F (CATHETERS) ×2 IMPLANT
CATH OPTITORQUE TIG 4.0 5F (CATHETERS) ×2 IMPLANT
DEVICE RAD COMP TR BAND LRG (VASCULAR PRODUCTS) ×2 IMPLANT
GLIDESHEATH SLEND SS 6F .021 (SHEATH) ×2 IMPLANT
GUIDEWIRE INQWIRE 1.5J.035X260 (WIRE) ×1 IMPLANT
INQWIRE 1.5J .035X260CM (WIRE) ×2
KIT HEART LEFT (KITS) ×2 IMPLANT
PACK CARDIAC CATHETERIZATION (CUSTOM PROCEDURE TRAY) ×2 IMPLANT
SHEATH PROBE COVER 6X72 (BAG) ×2 IMPLANT
TRANSDUCER W/STOPCOCK (MISCELLANEOUS) ×2 IMPLANT
TUBING CIL FLEX 10 FLL-RA (TUBING) ×2 IMPLANT

## 2021-05-02 NOTE — Interval H&P Note (Signed)
Cath Lab Visit (complete for each Cath Lab visit)  Clinical Evaluation Leading to the Procedure:   ACS: No.  Non-ACS:    Anginal Classification: CCS III  Anti-ischemic medical therapy: Minimal Therapy (1 class of medications)  Non-Invasive Test Results: Intermediate-risk stress test findings: cardiac mortality 1-3%/year  Prior CABG: No previous CABG      History and Physical Interval Note:  05/02/2021 7:49 AM  Victor [name]  has presented today for surgery, with the diagnosis of angina - abnormal stress test.  The various methods of treatment have been discussed with the patient and family. After consideration of risks, benefits and other options for treatment, the patient has consented to  Procedure(s): LEFT HEART CATH AND CORONARY ANGIOGRAPHY (N/A) as a surgical intervention.  The patient's history has been reviewed, patient examined, no change in status, stable for surgery.  I have reviewed the patient's chart and labs.  Questions were answered to the patient's satisfaction.     Nicki Guadalajara

## 2021-05-02 NOTE — Research (Signed)
IDENTIFY Informed Consent   Subject Name: Victor [name]  Subject met inclusion and exclusion criteria.  The informed consent form, study requirements and expectations were reviewed with the subject and questions and concerns were addressed prior to the signing of the consent form.  The subject verbalized understanding of the trail requirements.  The subject agreed to participate in the IDENTIFY trial and signed the informed consent.  The informed consent was obtained prior to performance of any protocol-specific procedures for the subject.  A copy of the signed informed consent was given to the subject and a copy was placed in the subject's medical record.  [name] 05/02/2021, 0710 am

## 2021-05-03 MED FILL — Heparin Sod (Porcine)-NaCl IV Soln 1000 Unit/500ML-0.9%: INTRAVENOUS | Qty: 500 | Status: AC

## 2021-05-18 ENCOUNTER — Ambulatory Visit (INDEPENDENT_AMBULATORY_CARE_PROVIDER_SITE_OTHER): Payer: BC Managed Care – PPO

## 2021-05-18 ENCOUNTER — Other Ambulatory Visit: Payer: Self-pay

## 2021-05-18 DIAGNOSIS — R011 Cardiac murmur, unspecified: Secondary | ICD-10-CM | POA: Diagnosis not present

## 2021-05-18 LAB — ECHOCARDIOGRAM COMPLETE
Area-P 1/2: 3.58 cm2
S' Lateral: 3 cm

## 2021-05-18 NOTE — Progress Notes (Signed)
e

## 2021-07-12 ENCOUNTER — Encounter: Payer: Self-pay | Admitting: Cardiology

## 2021-07-12 ENCOUNTER — Other Ambulatory Visit: Payer: Self-pay

## 2021-07-12 ENCOUNTER — Ambulatory Visit: Payer: BC Managed Care – PPO | Admitting: Cardiology

## 2021-07-12 VITALS — BP 122/78 | HR 99 | Ht 71.0 in | Wt 179.6 lb

## 2021-07-12 DIAGNOSIS — R9439 Abnormal result of other cardiovascular function study: Secondary | ICD-10-CM

## 2021-07-12 DIAGNOSIS — E7800 Pure hypercholesterolemia, unspecified: Secondary | ICD-10-CM

## 2021-07-12 DIAGNOSIS — I10 Essential (primary) hypertension: Secondary | ICD-10-CM | POA: Diagnosis not present

## 2021-07-12 DIAGNOSIS — F17210 Nicotine dependence, cigarettes, uncomplicated: Secondary | ICD-10-CM

## 2021-07-12 DIAGNOSIS — E782 Mixed hyperlipidemia: Secondary | ICD-10-CM

## 2021-07-12 NOTE — Patient Instructions (Signed)

## 2021-07-12 NOTE — Progress Notes (Signed)
Cardiology Office Note:    Date:  07/12/2021   ID:  [name], DOB [birth year], MRN [id number]  PCP:  Lonie Peak, PA-C  Cardiologist:  Garwin Brothers, MD   Referring MD: Lonie Peak, PA-C    ASSESSMENT:    1. Hypercholesterolemia   2. Hypertension, benign   3. Abnormal nuclear stress test   4. Mixed dyslipidemia   5. Cigarette smoker    PLAN:    In order of problems listed above:  Primary prevention stressed with patient.  Importance of compliance with diet medication stressed any vocalized understanding.  He was advised to walk at least half an hour a day 5 days a week and he promises to do so. Essential hypertension: Blood pressure stable and diet was emphasized.  Lifestyle modification urged.  Salt intake issues were discussed. Mixed dyslipidemia: Diet emphasized.  Patient is on statin therapy and lipids will be followed by primary care in accordance with guideline directed medical therapy. Cigarette smoking: He tells me that he quit several weeks ago and promises never to go back. He will be seen follow-up appointment as needed basis.  He had multiple questions which were answered to satisfaction.   Medication Adjustments/Labs and Tests Ordered: Current medicines are reviewed at length with the patient today.  Concerns regarding medicines are outlined above.  No orders of the defined types were placed in this encounter.  No orders of the defined types were placed in this encounter.    No chief complaint on file.    History of Present Illness:    Victor [name] is a 55 y.o. male.  Patient was evaluated by me for chest pain.  He had abnormal stress test.  Fortunately coronary angiography was unremarkable with anatomically normal-appearing coronary arteries.  Patient is here for follow-up.  He very happy about it.  Past Medical History:  Diagnosis Date   Abnormal nuclear stress test 04/28/2021   Angina pectoris (HCC) 04/28/2021   Anxiety  and depression    Back pain    Cardiac murmur 04/07/2021   Chest pain of uncertain etiology 04/07/2021   Chronic low back pain 12/03/2017   Cigarette smoker 04/07/2021   Degeneration of lumbar intervertebral disc 12/09/2017   Depression, major, recurrent, moderate (HCC) 01/18/2016   Epicondylitis, lateral    GAD (generalized anxiety disorder)    GERD (gastroesophageal reflux disease)    Hypercholesterolemia    Hypertension, benign    Lumbar disc disease with radiculopathy    Lumbar post-laminectomy syndrome 12/03/2017   Mixed dyslipidemia 04/07/2021   Opioid dependence with withdrawal (HCC) 01/18/2016   Osteoarthritis of left knee    Shoulder pain    Spinal stenosis of lumbar region 07/11/2016   Testosterone deficiency     Past Surgical History:  Procedure Laterality Date   BACK SURGERY     LEFT HEART CATH AND CORONARY ANGIOGRAPHY N/A 05/02/2021   Procedure: LEFT HEART CATH AND CORONARY ANGIOGRAPHY;  Surgeon: Lennette Bihari, MD;  Location: [facility];  Service: Cardiovascular;  Laterality: N/A;   LUMBAR LAMINECTOMY/DECOMPRESSION MICRODISCECTOMY Right 07/11/2016   Procedure: REVISON MICRO LUMBAR DECOMPRESSION L4 - L5 ON THE RIGHT;  Surgeon: Jene Every, MD;  Location: [facility];  Service: Orthopedics;  Laterality: Right;   ROTATOR CUFF REPAIR      Current Medications: Current Meds  Medication Sig   acetaminophen (TYLENOL) 500 MG tablet Take 500 mg by mouth 2 (two) times daily as needed for moderate pain or headache.   amLODipine (  NORVASC) 10 MG tablet Take 10 mg by mouth daily.   aspirin 81 MG chewable tablet Chew 81 mg by mouth daily.   busPIRone (BUSPAR) 15 MG tablet Take 15 mg by mouth 3 (three) times daily.   ibuprofen (ADVIL) 800 MG tablet Take 800 mg by mouth 3 (three) times daily.   losartan (COZAAR) 100 MG tablet Take 100 mg by mouth daily. for high blood pressure   metoprolol succinate (TOPROL-XL) 50 MG 24 hr tablet Take 50 mg by mouth daily.   nitroGLYCERIN (NITROSTAT)  0.4 MG SL tablet Place 0.4 mg under the tongue every 5 (five) minutes as needed for chest pain.   omeprazole (PRILOSEC) 40 MG capsule Take 40 mg by mouth daily.   rosuvastatin (CRESTOR) 10 MG tablet Take 10 mg by mouth daily.   testosterone cypionate (DEPOTESTOSTERONE CYPIONATE) 200 MG/ML injection Inject 100 mg into the muscle See admin instructions. Every 10 days     Allergies:   Celebrex [celecoxib], Gabapentin, Nsaids, and Tylenol [acetaminophen]   Social History   Socioeconomic History   Marital status: Married    Spouse name: Not on file   Number of children: Not on file   Years of education: Not on file   Highest education level: Not on file  Occupational History   Not on file  Tobacco Use   Smoking status: Former    Packs/day: 0.20    Years: 2.00    Pack years: 0.40    Types: Cigarettes   Smokeless tobacco: Never  Substance and Sexual Activity   Alcohol use: No   Drug use: Yes    Comment: Opiate pills   Sexual activity: Not on file  Other Topics Concern   Not on file  Social History Narrative   Not on file   Social Determinants of Health   Financial Resource Strain: Not on file  Food Insecurity: Not on file  Transportation Needs: Not on file  Physical Activity: Not on file  Stress: Not on file  Social Connections: Not on file     Family History: The patient's family history includes Diabetes in his mother; Hypertension in his father and mother.  ROS:   Please see the history of present illness.    All other systems reviewed and are negative.  EKGs/Labs/Other Studies Reviewed:    The following studies were reviewed today: Lennette Bihari, MD (Primary)     Procedures  LEFT HEART CATH AND CORONARY ANGIOGRAPHY   Conclusion    The left ventricular systolic function is normal. LV end diastolic pressure is normal. The left ventricular ejection fraction is 55-65% by visual estimate.   Normal epicardial coronary arteries with a dominant RCA system.    Normal LV function without focal segmental wall motion abnormalities.  Estimated EF 55%.   RECOMMENDATION:  Patient will return to Dr. Tomie China.  Suspect nonischemic etiology to the patient's chest pain.   Recent Labs: 04/28/2021: BUN 14; Creatinine, Ser 1.04; Hemoglobin 15.7; Platelets 226; Potassium 4.4; Sodium 139  Recent Lipid Panel No results found for: CHOL, TRIG, HDL, CHOLHDL, VLDL, LDLCALC, LDLDIRECT  Physical Exam:    VS:  BP 122/78   Pulse 99   Ht 5\' 11"  (1.803 m)   Wt 179 lb 9.6 oz (81.5 kg)   SpO2 94%   BMI 25.05 kg/m     Wt Readings from Last 3 Encounters:  07/12/21 179 lb 9.6 oz (81.5 kg)  05/02/21 183 lb (83 kg)  04/28/21 183 lb 12.8 oz (83.4 kg)  GEN: Patient is in no acute distress HEENT: Normal NECK: No JVD; No carotid bruits LYMPHATICS: No lymphadenopathy CARDIAC: Hear sounds regular, 2/6 systolic murmur at the apex. RESPIRATORY:  Clear to auscultation without rales, wheezing or rhonchi  ABDOMEN: Soft, non-tender, non-distended MUSCULOSKELETAL:  No edema; No deformity  SKIN: Warm and dry NEUROLOGIC:  Alert and oriented x 3 PSYCHIATRIC:  Normal affect   Signed, Garwin Brothers, MD  07/12/2021 11:05 AM    [facility]
